# Patient Record
Sex: Female | Born: 1970
Health system: Southern US, Community
[De-identification: ages and names within clinical notes are randomized; demographics above are authoritative.]

## PROBLEM LIST (undated history)

## (undated) DIAGNOSIS — M7700 Medial epicondylitis, unspecified elbow: Secondary | ICD-10-CM

## (undated) DIAGNOSIS — K222 Esophageal obstruction: Secondary | ICD-10-CM

## (undated) HISTORY — PX: UPPER GASTROINTESTINAL ENDOSCOPY: SHX188

## (undated) HISTORY — DX: Medial epicondylitis, unspecified elbow: M77.00

## (undated) HISTORY — DX: Esophageal obstruction: K22.2

## (undated) HISTORY — PX: OTHER SURGICAL HISTORY: SHX169

---

## 2011-11-16 LAB — HM PAP SMEAR

## 2013-11-15 LAB — HM PAP SMEAR

## 2015-03-07 ENCOUNTER — Other Ambulatory Visit: Payer: Self-pay | Admitting: Family

## 2015-03-07 DIAGNOSIS — Z1231 Encounter for screening mammogram for malignant neoplasm of breast: Secondary | ICD-10-CM

## 2015-04-09 ENCOUNTER — Ambulatory Visit
Admission: RE | Admit: 2015-04-09 | Discharge: 2015-04-09 | Disposition: A | Discharge status: Home / Self Care | Payer: BLUE CROSS/BLUE SHIELD | Source: Ambulatory Visit | Attending: Family | Admitting: Family

## 2015-04-09 DIAGNOSIS — Z1231 Encounter for screening mammogram for malignant neoplasm of breast: Secondary | ICD-10-CM

## 2015-04-09 LAB — HM MAMMOGRAPHY: HM MAMMO: NEGATIVE

## 2015-10-21 ENCOUNTER — Ambulatory Visit: Payer: BLUE CROSS/BLUE SHIELD | Admitting: Sports Medicine

## 2015-10-23 ENCOUNTER — Ambulatory Visit (INDEPENDENT_AMBULATORY_CARE_PROVIDER_SITE_OTHER): Payer: BLUE CROSS/BLUE SHIELD | Admitting: Sports Medicine

## 2015-10-23 ENCOUNTER — Encounter: Payer: Self-pay | Admitting: Sports Medicine

## 2015-10-23 VITALS — BP 114/70 | Ht 64.0 in | Wt 176.4 lb

## 2015-10-23 DIAGNOSIS — M7702 Medial epicondylitis, left elbow: Secondary | ICD-10-CM | POA: Diagnosis not present

## 2015-10-23 MED ORDER — NITROGLYCERIN 0.2 MG/HR TD PT24: MEDICATED_PATCH | TRANSDERMAL | Status: DC

## 2015-10-23 NOTE — Progress Notes (Signed)
   Subjective:    Patient ID: Erica David, female    DOB: [birth year], 44 y.o.   MRN: [id number]  HPI chief complaint: Right elbow pain  Very pleasant 44 year old right-hand-dominant female comes in today complaining of 5 months of right elbow pain. Pain began suddenly while playing tennis back in July without any trauma. She initially had medial sided elbow pain which has now progressed to involve the entire elbow. She has continued to play tennis despite her pain. Pain is present primarily with grip as well as with picking up heavy objects. She denies numbness or tingling. She does get some discomfort at night while trying to sleep. No problems with this elbow in the past. She has tried some acupuncture without any good success. She has not had any imaging.  Past medical history reviewed Medications reviewed Allergies reviewed    Review of Systems    as above Objective:   Physical Exam Well-developed, well-nourished. No acute distress. Awake alert and oriented 3. Vital signs reviewed  Right elbow: Full range of motion. No effusion. There is tenderness to palpation directly over the medial epicondyle with reproducible pain with resisted wrist flexion and ulnar deviation. There is no tenderness to palpation over the lateral epicondyle. Negative Tinel's over the cubital tunnel. Good grip strength. Good radial and ulnar pulses.  MSK ultrasound of the right elbow was performed. Limited images of the medial elbow were obtained. There is a hypoechoic area in the mid substance of the common extensor tendon consistent with a partial tendon tear. No significant neovascularity is seen.       Assessment & Plan:  Right elbow pain secondary to medial epicondylitis/common flexor tendon tendinopathy with partial tearing  Patient will start a topical nitroglycerin protocol. She will use a quarter patch daily. I would also like to send her for some formal physical therapy with the hand and  rehabilitation specialists. Follow-up with me in 6 weeks for reevaluation. In the meantime no tennis but I did explain to her the importance of working with a good coach and practicing proper form and avoiding certain strokes which may aggravate her pain. I also think she needs to have her tennis racquet evaluated to make sure that the grip is appropriate as well as the string tension.

## 2015-10-23 NOTE — Patient Instructions (Signed)

## 2015-12-04 ENCOUNTER — Encounter: Payer: Self-pay | Admitting: Sports Medicine

## 2015-12-04 ENCOUNTER — Ambulatory Visit (INDEPENDENT_AMBULATORY_CARE_PROVIDER_SITE_OTHER): Payer: BLUE CROSS/BLUE SHIELD | Admitting: Sports Medicine

## 2015-12-04 VITALS — BP 109/54 | Ht 64.0 in | Wt 176.0 lb

## 2015-12-04 DIAGNOSIS — M7701 Medial epicondylitis, right elbow: Secondary | ICD-10-CM | POA: Diagnosis not present

## 2015-12-05 NOTE — Progress Notes (Signed)
   Subjective:    Patient ID: Erica David, female    DOB: 02-28-1971, 45 y.o.   MRN: [id number]  HPI   Patient comes in today for follow-up on right elbow medial epicondylitis. It has been 6 weeks since her last office visit. Symptoms are improving. She is no longer having pain with activities of daily living. She actually returned to playing tennis a couple of days ago and had no pain while playing but was a little bit sore the following day. Today she is pain-free. She has been using a compression sleeve which she has found helpful. She has been using her topical nitroglycerin without side effect. She has also been working diligently with physical therapy. She is getting a little bit of discomfort on the lateral aspect of the elbow as well but describes it as more of a "tightness" more than pain. She continues to deny numbness or tingling into the forearm or hand. Prior ultrasound of this elbow showed a small partial tendon tear in the common extensor tendon.    Review of Systems     Objective:   Physical Exam Well-developed, well-nourished. No acute distress  Right elbow: Full range of motion. No effusion. No soft tissue swelling. Mild tenderness to palpation over the medial epicondyle but not marked. Minimal pain with resisted wrist flexion and ulnar deviation. Negative Tinel's at the cubital tunnel. No significant tenderness to palpation over the lateral elbow. Neurovascularly intact distally. Good grip strength.  MSK ultrasound of the right elbow was performed. Limited images of the medial elbow were obtained. The hypoechoic area seen on her prior ultrasound is no longer evident.       Assessment & Plan:  Improving right elbow pain secondary to medial epicondylitis  I want the patient to continue with her topical nitroglycerin for 6 more weeks for total of 3 months of treatment. I want her to continue working with physical therapy until they feel she is ready to graduate to a home  exercise program. I think she can use either her body helix compression sleeve or a counterforce brace, which ever one is more comfortable, with tennis. I did caution her about returning to tennis too quickly but I think she is okay to start with playing once a week. She is in the process of having her tennis racquet evaluated for string tension. I did explain to her that it may be another 6 weeks or so for complete symptom resolution. She will need to limit her activities, including tennis, based on her symptoms and continue with her home exercise program. As long as symptoms continue to improve to the point of resolution she may follow-up with me as needed.

## 2015-12-15 ENCOUNTER — Encounter: Payer: Self-pay | Admitting: Family Medicine

## 2015-12-15 ENCOUNTER — Ambulatory Visit (INDEPENDENT_AMBULATORY_CARE_PROVIDER_SITE_OTHER): Payer: BLUE CROSS/BLUE SHIELD | Admitting: Family Medicine

## 2015-12-15 ENCOUNTER — Institutional Professional Consult (permissible substitution): Payer: Self-pay | Admitting: Family Medicine

## 2015-12-15 VITALS — BP 122/64 | HR 68 | Ht 65.5 in | Wt 181.4 lb

## 2015-12-15 DIAGNOSIS — Z7189 Other specified counseling: Secondary | ICD-10-CM

## 2015-12-15 DIAGNOSIS — M7701 Medial epicondylitis, right elbow: Secondary | ICD-10-CM

## 2015-12-15 DIAGNOSIS — Z7689 Persons encountering health services in other specified circumstances: Secondary | ICD-10-CM

## 2015-12-15 DIAGNOSIS — Z23 Encounter for immunization: Secondary | ICD-10-CM

## 2015-12-15 DIAGNOSIS — M7700 Medial epicondylitis, unspecified elbow: Secondary | ICD-10-CM | POA: Insufficient documentation

## 2015-12-15 NOTE — Progress Notes (Signed)
   Subjective:    Patient ID: Erica David, female    DOB: 24-May-1971, 45 y.o.   MRN: [id number]  HPI Chief Complaint  Patient presents with  . new pt    new pt. get established. no other concerns, declines flu shots   She is new to the practice and here to establish primary care. She moved here from [place] a couple of years ago due to husband's job.  Other providers: Dr. Clint Bolder for tennis elbow. Is doing PT for same. Right elbow. Wears nitroglycerin patch to elbow daily until end of February. She has played tennis her whole life.   Last physical exam: 18 months ago. Pap smear- 2 years ago and mammogram- has them annually,  States her sister was diagnosed with breast cancer at age 75. She has a Corporate treasurer and is due to come out at age 37. She does not have menstrual cycles.  Past medical history- endoscopy in past, states she has gotten air bubble in past in throat based on the food she eats. She was taking Nexium daily. Stopped taking it after dilation of esophagus. Doing well with this.   Surgeries: Endoscopy x 2 with stretching esophagus.   colonoscopy: Never family history- sister with breast cancer at 11. HTN   Social history: denies smoking, social alcohol use, never used drugs.   Lives with husband and 3 kids. Works as an Optometrist. Likes to play tennis but has not been able to. Not really doing other exercise now.   She questions whether she needs a flu shot or not at this point.    Review of Systems Pertinent positives and negatives in the history of present illness.     Objective:   Physical Exam BP 122/64 mmHg  Pulse 68  Ht 5' 5.5" (1.664 m)  Wt 181 lb 6.4 oz (82.283 kg)  BMI 29.72 kg/m2  Alert and in no distress.  Cardiac exam shows a regular sinus rhythm without murmurs or gallops. Lungs are clear to auscultation.      Assessment & Plan:  Epicondylitis elbow, medial, right  Encounter to establish care  Need for prophylactic vaccination and inoculation  against influenza - Plan: Flu Vaccine QUAD 36+ mos IM   Recommend she continue seeing Dr. Micheline Chapman for tennis elbow, symptoms appear to be improving with current treatment regimen.  Recommend that while she cannot play tennis that she try to do other forms of cardiovascular exercise.  Flu shot given.   discussed that if she continues to have issues with esophageal pain or food impactions that I will refer her to gastroenterology. She will continue being careful when chewing meat and other foods that could cause obstruction.  she will return for fasting blood work and a complete physical exam including Pap smear.  She will also need referral for mammogram.

## 2015-12-24 ENCOUNTER — Ambulatory Visit (INDEPENDENT_AMBULATORY_CARE_PROVIDER_SITE_OTHER): Payer: BLUE CROSS/BLUE SHIELD | Admitting: Family Medicine

## 2015-12-24 ENCOUNTER — Encounter: Payer: Self-pay | Admitting: Family Medicine

## 2015-12-24 ENCOUNTER — Other Ambulatory Visit (HOSPITAL_COMMUNITY)
Admission: RE | Admit: 2015-12-24 | Discharge: 2015-12-24 | Disposition: A | Discharge status: Home / Self Care | Payer: BLUE CROSS/BLUE SHIELD | Source: Ambulatory Visit | Attending: Family Medicine | Admitting: Family Medicine

## 2015-12-24 VITALS — BP 116/64 | HR 60 | Ht 66.0 in | Wt 182.6 lb

## 2015-12-24 DIAGNOSIS — Z0000 Encounter for general adult medical examination without abnormal findings: Secondary | ICD-10-CM | POA: Diagnosis not present

## 2015-12-24 DIAGNOSIS — Z124 Encounter for screening for malignant neoplasm of cervix: Secondary | ICD-10-CM

## 2015-12-24 DIAGNOSIS — R5383 Other fatigue: Secondary | ICD-10-CM | POA: Diagnosis not present

## 2015-12-24 DIAGNOSIS — Z803 Family history of malignant neoplasm of breast: Secondary | ICD-10-CM

## 2015-12-24 DIAGNOSIS — M25561 Pain in right knee: Secondary | ICD-10-CM

## 2015-12-24 DIAGNOSIS — Z01411 Encounter for gynecological examination (general) (routine) with abnormal findings: Secondary | ICD-10-CM | POA: Diagnosis present

## 2015-12-24 DIAGNOSIS — Z1151 Encounter for screening for human papillomavirus (HPV): Secondary | ICD-10-CM | POA: Diagnosis not present

## 2015-12-24 DIAGNOSIS — Z1211 Encounter for screening for malignant neoplasm of colon: Secondary | ICD-10-CM | POA: Diagnosis not present

## 2015-12-24 DIAGNOSIS — Z1239 Encounter for other screening for malignant neoplasm of breast: Secondary | ICD-10-CM

## 2015-12-24 LAB — CBC WITH DIFFERENTIAL/PLATELET
Basophils Absolute: 0 10*3/uL (ref 0.0–0.1)
Basophils Relative: 0 % (ref 0–1)
EOS PCT: 5 % (ref 0–5)
Eosinophils Absolute: 0.3 10*3/uL (ref 0.0–0.7)
HCT: 41.8 % (ref 36.0–46.0)
Hemoglobin: 13.8 g/dL (ref 12.0–15.0)
LYMPHS ABS: 1.6 10*3/uL (ref 0.7–4.0)
LYMPHS PCT: 29 % (ref 12–46)
MCH: 30.3 pg (ref 26.0–34.0)
MCHC: 33 g/dL (ref 30.0–36.0)
MCV: 91.9 fL (ref 78.0–100.0)
MONO ABS: 0.4 10*3/uL (ref 0.1–1.0)
MPV: 9.2 fL (ref 8.6–12.4)
Monocytes Relative: 8 % (ref 3–12)
Neutro Abs: 3.1 10*3/uL (ref 1.7–7.7)
Neutrophils Relative %: 58 % (ref 43–77)
Platelets: 212 10*3/uL (ref 150–400)
RBC: 4.55 MIL/uL (ref 3.87–5.11)
RDW: 13.9 % (ref 11.5–15.5)
WBC: 5.4 10*3/uL (ref 4.0–10.5)

## 2015-12-24 LAB — POCT URINALYSIS DIPSTICK
Bilirubin, UA: NEGATIVE
GLUCOSE UA: NEGATIVE
Ketones, UA: NEGATIVE
LEUKOCYTES UA: NEGATIVE
NITRITE UA: NEGATIVE
Protein, UA: NEGATIVE
RBC UA: NEGATIVE
UROBILINOGEN UA: NEGATIVE
pH, UA: 6

## 2015-12-24 LAB — COMPREHENSIVE METABOLIC PANEL
ALT: 10 U/L (ref 6–29)
AST: 21 U/L (ref 10–35)
Albumin: 3.9 g/dL (ref 3.6–5.1)
Alkaline Phosphatase: 51 U/L (ref 33–115)
BUN: 10 mg/dL (ref 7–25)
CALCIUM: 8.8 mg/dL (ref 8.6–10.2)
CO2: 27 mmol/L (ref 20–31)
Chloride: 105 mmol/L (ref 98–110)
Creat: 0.72 mg/dL (ref 0.50–1.10)
GLUCOSE: 83 mg/dL (ref 65–99)
POTASSIUM: 4.3 mmol/L (ref 3.5–5.3)
Sodium: 139 mmol/L (ref 135–146)
Total Bilirubin: 0.5 mg/dL (ref 0.2–1.2)
Total Protein: 6.5 g/dL (ref 6.1–8.1)

## 2015-12-24 LAB — LIPID PANEL
CHOLESTEROL: 145 mg/dL (ref 125–200)
HDL: 53 mg/dL (ref >=46)
LDL CALC: 82 mg/dL (ref <130)
Total CHOL/HDL Ratio: 2.7 Ratio (ref <=5.0)
Triglycerides: 49 mg/dL (ref <150)
VLDL: 10 mg/dL (ref <30)

## 2015-12-24 LAB — TSH: TSH: 2.78 m[IU]/L

## 2015-12-24 NOTE — Progress Notes (Signed)
Subjective:    Patient ID: Erica David, female    DOB: August 25, 1971, 44 y.o.   MRN: [id number]  HPI Chief Complaint  Patient presents with  . fasting cpe    fasting cpe with pap. sees eye doctor. knee pain from playing tennis   She is here for a complete physical exam and fasting blood work. She was playing tennis yesterday and states her right knee started hurting. Reports 2 week history of right knee swelling and pain with certain movements. No known injury. Reports pain improved with rest. Denies locking, popping, giving away. Seeing [facility] for right elbow pain, was diagnosed with medial epicondylitis and is being treated for this. She states he also examined her knee and told her that there is no structural or cartilage damage.   Reports feeling more tired recently, approximately for the past couple of months. She states this could be related to keeping up with her 3 children but questions whether something could be wrong physically. Reports level of fatigue as mild.  States she does not have difficulty sleeping.  Denies fever, chills, weight loss, chest pain, dizziness, GI or GU issues.   Last physical was 2015.  Eye exam was in December 2016. Wears contact lenses.  No hearing issues.   She has a Mirena IUD and it is scheduled to come out 2020.  Last pap smear: 11/2011 Mammogram: 03/2015 Colonoscopy: never   She lives with husband and has 3 kids. She denies smoking and drug use. Drinks occasionally.   Family history of breast cancer in sister. HTN.   Immunizations: flu shot- current. Tdap 07/2014 Wears sunscreen, always wears seatbelt, no guns in home. Smoke detectors in home and working.   Reviewed allergies, medications, past medical, surgical, social and family history.   Review of Systems Review of Systems Constitutional: -fever, -chills, -sweats, -unexpected weight change,+fatigue ENT: -runny nose, -ear pain, -sore throat Cardiology:  -chest pain,  -palpitations, -edema Respiratory: -cough, -shortness of breath, -wheezing Gastroenterology: -abdominal pain, -nausea, -vomiting, -diarrhea, -constipation  Hematology: -bleeding or bruising problems Musculoskeletal: -arthralgias, -myalgias, -joint swelling, -back pain Ophthalmology: -vision changes Urology: -dysuria, -difficulty urinating, -hematuria, -urinary frequency, -urgency Neurology: -headache, -weakness, -tingling, -numbness       Objective:   Physical Exam BP 116/64 mmHg  Pulse 60  Ht 5\' 6"  (1.676 m)  Wt 182 lb 9.6 oz (82.827 kg)  BMI 29.49 kg/m2  General Appearance:    Alert, cooperative, no distress, appears stated age  Head:    Normocephalic, without obvious abnormality, atraumatic  Eyes:    PERRL, conjunctiva/corneas clear, EOM's intact, fundi    benign  Ears:    Normal TM's and external ear canals  Nose:   Nares normal, mucosa normal, no drainage or sinus   tenderness  Throat:   Lips, mucosa, and tongue normal; teeth and gums normal  Neck:   Supple, no lymphadenopathy;  thyroid:  no   enlargement/tenderness/nodules; no carotid   bruit or JVD  Back:    Spine nontender, no curvature, ROM normal, no CVA     tenderness  Lungs:     Clear to auscultation bilaterally without wheezes, rales or     ronchi; respirations unlabored  Chest Wall:    No tenderness or deformity   Heart:    Regular rate and rhythm, S1 and S2 normal, no murmur, rub   or gallop  Breast Exam:    No tenderness, masses, or nipple discharge or inversion.      No axillary  lymphadenopathy  Abdomen:     Soft, non-tender, nondistended, normoactive bowel sounds,    no masses, no hepatosplenomegaly  Genitalia:    Normal external genitalia without lesions.  BUS and vagina normal; cervix without lesions, or cervical motion tenderness. No abnormal vaginal discharge.  Uterus and adnexa not enlarged, nontender, no masses.  Pap performed. IUD strings visible.   Rectal:    Not performed. Fecal occult stool cards  given.   Extremities:   No clubbing, cyanosis or edema. Right knee with mild edema to anterior lateral aspect, no erythema, non tender, no laxity, normal ROM and strength, negative Mcmurrays.   Pulses:   2+ and symmetric all extremities  Skin:   Skin color, texture, turgor normal, no rashes or lesions  Lymph nodes:   Cervical, supraclavicular, and axillary nodes normal  Neurologic:   CNII-XII intact, normal strength, sensation and gait; reflexes 2+ and symmetric throughout          Psych:   Normal mood, affect, hygiene and grooming.    Urinalysis dipstick: negative     Assessment & Plan:  Routine general medical examination at a health care facility - Plan: CBC with Differential/Platelet, Comprehensive metabolic panel, POCT urinalysis dipstick, Lipid panel, VITAMIN D 25 Hydroxy (Vit-D Deficiency, Fractures), TSH, Fecal Occult Blood, Guaiac  Other fatigue - Plan: CBC with Differential/Platelet, Comprehensive metabolic panel, VITAMIN D 25 Hydroxy (Vit-D Deficiency, Fractures), TSH  Knee pain, acute, right - Plan: VITAMIN D 25 Hydroxy (Vit-D Deficiency, Fractures)  Screening for cervical cancer - Plan: Cytology - PAP  Screening for breast cancer - Plan: MM DIGITAL SCREENING BILATERAL  Family history of breast cancer in sister - Plan: MM DIGITAL SCREENING BILATERAL  Special screening for malignant neoplasms, colon - Plan: Fecal Occult Blood, Guaiac  Discuss multiple etiologies for fatigue. Discussed eating healthy diet and getting at least 150 minutes of physical activity per week. Her level of fatigue appears to be mild and is not keeping her from her daily activities, will follow up on this pending lab results.  Discussed starting vitamin D supplement over the counter.  Recommend low impact exercise until right knee edema and discomfort calms down. She may try taking ibuprofen or Aleve for this as well. Discussed that after exercise, applying ice to the knee for 15-20 minutes may improve  pain and swelling.  Discussed safety and health promotion. Order in the computer for screening mammogram, she is due for this in May 2017.  3 fecal occult stool cards given with instructions.

## 2015-12-24 NOTE — Patient Instructions (Addendum)
Petra Kuba Made is brand I recommend for vitamin D. 800 or 1000 IU daily.   Preventative Care for Adults - Female      MAINTAIN REGULAR HEALTH EXAMS:  A routine yearly physical is a good way to check in with your primary care provider about your health and preventive screening. It is also an opportunity to share updates about your health and any concerns you have, and receive a thorough all-over exam.   Most health insurance companies pay for at least some preventative services.  Check with your health plan for specific coverages.  WHAT PREVENTATIVE SERVICES DO WOMEN NEED?  Adult women should have their weight and blood pressure checked regularly.   Women age 23 and older should have their cholesterol levels checked regularly.  Women should be screened for cervical cancer with a Pap smear and pelvic exam beginning at either age 37, or 3 years after they become sexually activity.    Breast cancer screening generally begins at age 57 with a mammogram and breast exam by your primary care provider.    Beginning at age 54 and continuing to age 39, women should be screened for colorectal cancer.  Certain people may need continued testing until age 79.  Updating vaccinations is part of preventative care.  Vaccinations help protect against diseases such as the flu.  Osteoporosis is a disease in which the bones lose minerals and strength as we age. Women ages 32 and over should discuss this with their caregivers, as should women after menopause who have other risk factors.  Lab tests are generally done as part of preventative care to screen for anemia and blood disorders, to screen for problems with the kidneys and liver, to screen for bladder problems, to check blood sugar, and to check your cholesterol level.  Preventative services generally include counseling about diet, exercise, avoiding tobacco, drugs, excessive alcohol consumption, and sexually transmitted infections.    GENERAL  RECOMMENDATIONS FOR GOOD HEALTH:  Healthy diet:  Eat a variety of foods, including fruit, vegetables, animal or vegetable protein, such as meat, fish, chicken, and eggs, or beans, lentils, tofu, and grains, such as rice.  Drink plenty of water daily.  Decrease saturated fat in the diet, avoid lots of red meat, processed foods, sweets, fast foods, and fried foods.  Exercise:  Aerobic exercise helps maintain good heart health. At least 30-40 minutes of moderate-intensity exercise is recommended. For example, a brisk walk that increases your heart rate and breathing. This should be done on most days of the week.   Find a type of exercise or a variety of exercises that you enjoy so that it becomes a part of your daily life.  Examples are running, walking, swimming, water aerobics, and biking.  For motivation and support, explore group exercise such as aerobic class, spin class, Zumba, Yoga,or  martial arts, etc.    Set exercise goals for yourself, such as a certain weight goal, walk or run in a race such as a 5k walk/run.  Speak to your primary care provider about exercise goals.  Disease prevention:  If you smoke or chew tobacco, find out from your caregiver how to quit. It can literally save your life, no matter how long you have been a tobacco user. If you do not use tobacco, never begin.   Maintain a healthy diet and normal weight. Increased weight leads to problems with blood pressure and diabetes.   The Body Mass Index or BMI is a way of measuring how much  of your body is fat. Having a BMI above 27 increases the risk of heart disease, diabetes, hypertension, stroke and other problems related to obesity. Your caregiver can help determine your BMI and based on it develop an exercise and dietary program to help you achieve or maintain this important measurement at a healthful level.  High blood pressure causes heart and blood vessel problems.  Persistent high blood pressure should be treated  with medicine if weight loss and exercise do not work.   Fat and cholesterol leaves deposits in your arteries that can block them. This causes heart disease and vessel disease elsewhere in your body.  If your cholesterol is found to be high, or if you have heart disease or certain other medical conditions, then you may need to have your cholesterol monitored frequently and be treated with medication.   Ask if you should have a cardiac stress test if your history suggests this. A stress test is a test done on a treadmill that looks for heart disease. This test can find disease prior to there being a problem.  Menopause can be associated with physical symptoms and risks. Hormone replacement therapy is available to decrease these. You should talk to your caregiver about whether starting or continuing to take hormones is right for you.   Osteoporosis is a disease in which the bones lose minerals and strength as we age. This can result in serious bone fractures. Risk of osteoporosis can be identified using a bone density scan. Women ages 35 and over should discuss this with their caregivers, as should women after menopause who have other risk factors. Ask your caregiver whether you should be taking a calcium supplement and Vitamin D, to reduce the rate of osteoporosis.   Avoid drinking alcohol in excess (more than two drinks per day).  Avoid use of street drugs. Do not share needles with anyone. Ask for professional help if you need assistance or instructions on stopping the use of alcohol, cigarettes, and/or drugs.  Brush your teeth twice a day with fluoride toothpaste, and floss once a day. Good oral hygiene prevents tooth decay and gum disease. The problems can be painful, unattractive, and can cause other health problems. Visit your dentist for a routine oral and dental check up and preventive care every 6-12 months.   Look at your skin regularly.  Use a mirror to look at your back. Notify your  caregivers of changes in moles, especially if there are changes in shapes, colors, a size larger than a pencil eraser, an irregular border, or development of new moles.  Safety:  Use seatbelts 100% of the time, whether driving or as a passenger.  Use safety devices such as hearing protection if you work in environments with loud noise or significant background noise.  Use safety glasses when doing any work that could send debris in to the eyes.  Use a helmet if you ride a bike or motorcycle.  Use appropriate safety gear for contact sports.  Talk to your caregiver about gun safety.  Use sunscreen with a SPF (or skin protection factor) of 15 or greater.  Lighter skinned people are at a greater risk of skin cancer. Don't forget to also wear sunglasses in order to protect your eyes from too much damaging sunlight. Damaging sunlight can accelerate cataract formation.   Practice safe sex. Use condoms. Condoms are used for birth control and to help reduce the spread of sexually transmitted infections (or STIs).  Some of the STIs are  gonorrhea (the clap), chlamydia, syphilis, trichomonas, herpes, HPV (human papilloma virus) and HIV (human immunodeficiency virus) which causes AIDS. The herpes, HIV and HPV are viral illnesses that have no cure. These can result in disability, cancer and death.   Keep carbon monoxide and smoke detectors in your home functioning at all times. Change the batteries every 6 months or use a model that plugs into the wall.   Vaccinations:  Stay up to date with your tetanus shots and other required immunizations. You should have a booster for tetanus every 10 years. Be sure to get your flu shot every year, since 5%-20% of the [place] population comes down with the flu. The flu vaccine changes each year, so being vaccinated once is not enough. Get your shot in the fall, before the flu season peaks.   Other vaccines to consider:  Human Papilloma Virus or HPV causes cancer of the cervix,  and other infections that can be transmitted from person to person. There is a vaccine for HPV, and females should get immunized between the ages of 74 and 49. It requires a series of 3 shots.   Pneumococcal vaccine to protect against certain types of pneumonia.  This is normally recommended for adults age 44 or older.  However, adults younger than 47 years old with certain underlying conditions such as diabetes, heart or lung disease should also receive the vaccine.  Shingles vaccine to protect against Varicella Zoster if you are older than age 65, or younger than 47 years old with certain underlying illness.  Hepatitis A vaccine to protect against a form of infection of the liver by a virus acquired from food.  Hepatitis B vaccine to protect against a form of infection of the liver by a virus acquired from blood or body fluids, particularly if you work in health care.  If you plan to travel internationally, check with your local health department for specific vaccination recommendations.  Cancer Screening:  Breast cancer screening is essential to preventive care for women. All women age 62 and older should perform a breast self-exam every month. At age 69 and older, women should have their caregiver complete a breast exam each year. Women at ages 41 and older should have a mammogram (x-ray film) of the breasts. Your caregiver can discuss how often you need mammograms.    Cervical cancer screening includes taking a Pap smear (sample of cells examined under a microscope) from the cervix (end of the uterus). It also includes testing for HPV (Human Papilloma Virus, which can cause cervical cancer). Screening and a pelvic exam should begin at age 46, or 3 years after a woman becomes sexually active. Screening should occur every year, with a Pap smear but no HPV testing, up to age 98. After age 44, you should have a Pap smear every 3 years with HPV testing, if no HPV was found previously.   Most  routine colon cancer screening begins at the age of 2. On a yearly basis, doctors may provide special easy to use take-home tests to check for hidden blood in the stool. Sigmoidoscopy or colonoscopy can detect the earliest forms of colon cancer and is life saving. These tests use a small camera at the end of a tube to directly examine the colon. Speak to your caregiver about this at age 18, when routine screening begins (and is repeated every 5 years unless early forms of pre-cancerous polyps or small growths are found).

## 2015-12-25 LAB — CYTOLOGY - PAP

## 2015-12-25 LAB — VITAMIN D 25 HYDROXY (VIT D DEFICIENCY, FRACTURES): Vit D, 25-Hydroxy: 33 ng/mL (ref 30–100)

## 2015-12-29 ENCOUNTER — Other Ambulatory Visit (INDEPENDENT_AMBULATORY_CARE_PROVIDER_SITE_OTHER): Payer: BLUE CROSS/BLUE SHIELD

## 2015-12-29 DIAGNOSIS — Z0000 Encounter for general adult medical examination without abnormal findings: Secondary | ICD-10-CM | POA: Diagnosis not present

## 2015-12-29 LAB — POC HEMOCCULT BLD/STL (HOME/3-CARD/SCREEN)
Card #2 Fecal Occult Blod, POC: NEGATIVE
FECAL OCCULT BLD: NEGATIVE
FECAL OCCULT BLD: NEGATIVE

## 2016-04-15 ENCOUNTER — Ambulatory Visit
Admission: RE | Admit: 2016-04-15 | Discharge: 2016-04-15 | Disposition: A | Discharge status: Home / Self Care | Payer: BLUE CROSS/BLUE SHIELD | Source: Ambulatory Visit | Attending: Family Medicine | Admitting: Family Medicine

## 2016-04-15 DIAGNOSIS — Z1231 Encounter for screening mammogram for malignant neoplasm of breast: Secondary | ICD-10-CM | POA: Diagnosis not present

## 2016-04-15 DIAGNOSIS — Z803 Family history of malignant neoplasm of breast: Secondary | ICD-10-CM

## 2016-04-15 DIAGNOSIS — Z1239 Encounter for other screening for malignant neoplasm of breast: Secondary | ICD-10-CM

## 2016-06-11 DIAGNOSIS — M7701 Medial epicondylitis, right elbow: Secondary | ICD-10-CM | POA: Diagnosis not present

## 2016-06-11 DIAGNOSIS — M9902 Segmental and somatic dysfunction of thoracic region: Secondary | ICD-10-CM | POA: Diagnosis not present

## 2016-06-11 DIAGNOSIS — M7711 Lateral epicondylitis, right elbow: Secondary | ICD-10-CM | POA: Diagnosis not present

## 2016-06-11 DIAGNOSIS — M9907 Segmental and somatic dysfunction of upper extremity: Secondary | ICD-10-CM | POA: Diagnosis not present

## 2016-06-11 DIAGNOSIS — M9901 Segmental and somatic dysfunction of cervical region: Secondary | ICD-10-CM | POA: Diagnosis not present

## 2016-06-17 DIAGNOSIS — M7701 Medial epicondylitis, right elbow: Secondary | ICD-10-CM | POA: Diagnosis not present

## 2016-06-17 DIAGNOSIS — M7711 Lateral epicondylitis, right elbow: Secondary | ICD-10-CM | POA: Diagnosis not present

## 2016-06-17 DIAGNOSIS — M9907 Segmental and somatic dysfunction of upper extremity: Secondary | ICD-10-CM | POA: Diagnosis not present

## 2016-06-17 DIAGNOSIS — M9902 Segmental and somatic dysfunction of thoracic region: Secondary | ICD-10-CM | POA: Diagnosis not present

## 2016-06-17 DIAGNOSIS — M9901 Segmental and somatic dysfunction of cervical region: Secondary | ICD-10-CM | POA: Diagnosis not present

## 2016-06-22 DIAGNOSIS — M7701 Medial epicondylitis, right elbow: Secondary | ICD-10-CM | POA: Diagnosis not present

## 2016-06-22 DIAGNOSIS — M7711 Lateral epicondylitis, right elbow: Secondary | ICD-10-CM | POA: Diagnosis not present

## 2016-06-22 DIAGNOSIS — M9902 Segmental and somatic dysfunction of thoracic region: Secondary | ICD-10-CM | POA: Diagnosis not present

## 2016-06-22 DIAGNOSIS — M9907 Segmental and somatic dysfunction of upper extremity: Secondary | ICD-10-CM | POA: Diagnosis not present

## 2016-06-22 DIAGNOSIS — M9901 Segmental and somatic dysfunction of cervical region: Secondary | ICD-10-CM | POA: Diagnosis not present

## 2016-06-28 DIAGNOSIS — M9907 Segmental and somatic dysfunction of upper extremity: Secondary | ICD-10-CM | POA: Diagnosis not present

## 2016-06-28 DIAGNOSIS — M9902 Segmental and somatic dysfunction of thoracic region: Secondary | ICD-10-CM | POA: Diagnosis not present

## 2016-06-28 DIAGNOSIS — M9901 Segmental and somatic dysfunction of cervical region: Secondary | ICD-10-CM | POA: Diagnosis not present

## 2016-06-28 DIAGNOSIS — M7711 Lateral epicondylitis, right elbow: Secondary | ICD-10-CM | POA: Diagnosis not present

## 2016-06-28 DIAGNOSIS — M7701 Medial epicondylitis, right elbow: Secondary | ICD-10-CM | POA: Diagnosis not present

## 2016-07-22 DIAGNOSIS — M7711 Lateral epicondylitis, right elbow: Secondary | ICD-10-CM | POA: Diagnosis not present

## 2016-07-22 DIAGNOSIS — M9901 Segmental and somatic dysfunction of cervical region: Secondary | ICD-10-CM | POA: Diagnosis not present

## 2016-07-22 DIAGNOSIS — M9902 Segmental and somatic dysfunction of thoracic region: Secondary | ICD-10-CM | POA: Diagnosis not present

## 2016-07-22 DIAGNOSIS — M7701 Medial epicondylitis, right elbow: Secondary | ICD-10-CM | POA: Diagnosis not present

## 2016-07-22 DIAGNOSIS — M9907 Segmental and somatic dysfunction of upper extremity: Secondary | ICD-10-CM | POA: Diagnosis not present

## 2016-08-05 DIAGNOSIS — M7711 Lateral epicondylitis, right elbow: Secondary | ICD-10-CM | POA: Diagnosis not present

## 2016-08-05 DIAGNOSIS — M7701 Medial epicondylitis, right elbow: Secondary | ICD-10-CM | POA: Diagnosis not present

## 2016-08-05 DIAGNOSIS — M9901 Segmental and somatic dysfunction of cervical region: Secondary | ICD-10-CM | POA: Diagnosis not present

## 2016-08-05 DIAGNOSIS — M9902 Segmental and somatic dysfunction of thoracic region: Secondary | ICD-10-CM | POA: Diagnosis not present

## 2016-08-05 DIAGNOSIS — M9907 Segmental and somatic dysfunction of upper extremity: Secondary | ICD-10-CM | POA: Diagnosis not present

## 2016-09-04 DIAGNOSIS — M7701 Medial epicondylitis, right elbow: Secondary | ICD-10-CM | POA: Diagnosis not present

## 2016-09-04 DIAGNOSIS — M9907 Segmental and somatic dysfunction of upper extremity: Secondary | ICD-10-CM | POA: Diagnosis not present

## 2016-09-04 DIAGNOSIS — M9901 Segmental and somatic dysfunction of cervical region: Secondary | ICD-10-CM | POA: Diagnosis not present

## 2016-09-04 DIAGNOSIS — M7711 Lateral epicondylitis, right elbow: Secondary | ICD-10-CM | POA: Diagnosis not present

## 2016-09-04 DIAGNOSIS — M9902 Segmental and somatic dysfunction of thoracic region: Secondary | ICD-10-CM | POA: Diagnosis not present

## 2016-09-09 DIAGNOSIS — M9903 Segmental and somatic dysfunction of lumbar region: Secondary | ICD-10-CM | POA: Diagnosis not present

## 2016-09-09 DIAGNOSIS — M7711 Lateral epicondylitis, right elbow: Secondary | ICD-10-CM | POA: Diagnosis not present

## 2016-09-09 DIAGNOSIS — M7701 Medial epicondylitis, right elbow: Secondary | ICD-10-CM | POA: Diagnosis not present

## 2016-09-09 DIAGNOSIS — M9901 Segmental and somatic dysfunction of cervical region: Secondary | ICD-10-CM | POA: Diagnosis not present

## 2016-10-01 DIAGNOSIS — Z23 Encounter for immunization: Secondary | ICD-10-CM | POA: Diagnosis not present

## 2016-11-23 DIAGNOSIS — H5203 Hypermetropia, bilateral: Secondary | ICD-10-CM | POA: Diagnosis not present

## 2016-12-26 ENCOUNTER — Encounter: Payer: Self-pay | Admitting: Family Medicine

## 2016-12-26 NOTE — Progress Notes (Signed)
Subjective:    Patient ID: Erica David, female    DOB: 1971/03/27, 46 y.o.   MRN: [id number]  HPI Chief Complaint  Patient presents with  . fasting cpe    fasting cpe. no other concerns. had eyes examed early january.    She is here for a complete physical exam.  Reports having an issue with food getting stuck sometime in December and she was unable to swallow her saliva. Resolved after 2-3 hours and she had to vomit to clear the food. Has to be careful with eating certain foods. History of dysphagia and esophageal dilation when she lived in [place] approximately 3 years ago. Does not have a GI here.   Other providers: dermatologist - has appt in April. Has an eye doctor.   Family history: breast cancer- 2 sister in their 52s.   Social history: Lives with husband and 3 kids, works as  Denies smoking, drinking alcohol, drug use  Diet: healthy  Excerise: tennis 3 times per week.   Immunizations: Tdap 07/2014. Flu shot 10/2016 at [facility].   Health maintenance:  Mammogram: 04/15/2016 Colonoscopy: never Last Pap Smear: 12/2015 Last Menstrual cycle: years  Contraception: mirena  Last Dental Exam: regular visits.  Last Eye Exam: 11/2016  Depression screen [facility] 2/9 12/27/2016 12/04/2015 12/04/2015  Decreased Interest 0 0 0  Down, Depressed, Hopeless 0 0 0  PHQ - 2 Score 0 0 0     Wears seatbelt always, uses sunscreen, smoke detectors in home and functioning, does not text while driving and feels safe in home environment.   Reviewed allergies, medications, past medical, surgical, family, and social history.    Review of Systems Review of Systems Constitutional: -fever, -chills, -sweats, -unexpected weight change,-fatigue ENT: -runny nose, -ear pain, -sore throat Cardiology:  -chest pain, -palpitations, -edema Respiratory: -cough, -shortness of breath, -wheezing Gastroenterology: -abdominal pain, -nausea, -vomiting, -diarrhea, -constipation  Hematology: -bleeding or  bruising problems Musculoskeletal: -arthralgias, -myalgias, -joint swelling, -back pain Ophthalmology: -vision changes Urology: -dysuria, -difficulty urinating, -hematuria, -urinary frequency, -urgency Neurology: -headache, -weakness, -tingling, -numbness       Objective:   Physical Exam BP 104/64   Pulse (!) 57   Ht 5' 5.5" (1.664 m)   Wt 175 lb (79.4 kg)   BMI 28.68 kg/m   General Appearance:    Alert, cooperative, no distress, appears stated age  Head:    Normocephalic, without obvious abnormality, atraumatic  Eyes:    PERRL, conjunctiva/corneas clear, EOM's intact, fundi    benign  Ears:    Normal TM's and external ear canals  Nose:   Nares normal, mucosa normal, no drainage or sinus   tenderness  Throat:   Lips, mucosa, and tongue normal; teeth and gums normal  Neck:   Supple, no lymphadenopathy;  thyroid:  no   enlargement/tenderness/nodules; no carotid   bruit or JVD  Back:    Spine nontender, no curvature, ROM normal, no CVA     tenderness  Lungs:     Clear to auscultation bilaterally without wheezes, rales or     ronchi; respirations unlabored  Chest Wall:    No tenderness or deformity   Heart:    Regular rate and rhythm, S1 and S2 normal, no murmur, rub   or gallop  Breast Exam:    Mammogram ordered.   Abdomen:     Soft, non-tender, nondistended, normoactive bowel sounds,    no masses, no hepatosplenomegaly  Genitalia:    Declined. Pap smear normal and neg HPV last  year.   Rectal:    Normal tone, no masses or tenderness; guaiac negative stool  Extremities:   No clubbing, cyanosis or edema  Pulses:   2+ and symmetric all extremities  Skin:   Skin color, texture, turgor normal, no rashes or lesions  Lymph nodes:   Cervical, supraclavicular, and axillary nodes normal  Neurologic:   CNII-XII intact, normal strength, sensation and gait; reflexes 2+ and symmetric throughout          Psych:   Normal mood, affect, hygiene and grooming.    Urinalysis dipstick: leuk  otherwise normal.      Assessment & Plan:  Routine general medical examination at a health care facility - Plan: Urinalysis Dipstick, CBC with Differential/Platelet, Comprehensive metabolic panel, Lipid panel  Dysphagia, unspecified type - Plan: Ambulatory referral to Gastroenterology  History of esophageal dilatation - Plan: Ambulatory referral to Gastroenterology  Screening for breast cancer - Plan: MM DIGITAL SCREENING BILATERAL  Family history of breast cancer in sister - Plan: MM DIGITAL SCREENING BILATERAL  Discussed that she appears to be doing well. She is eating healthy and exercising. 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  Discussed safety and health promotion.  Up to date with immunizations.  Pap smear normal and neg HPV last year. Not indicated this year.  Has a full skin check scheduled with dermatologist in April.  Discussed option of genetic testing due to 2 sisters with breast cancer in their 30s. She does not seem interested in pursuing this. She will call and schedule her mammogram. Recommend monthly self breast exams.  Referral to GI for evaluation of dysphagia.  Will follow up pending labs and mammogram.

## 2016-12-27 ENCOUNTER — Ambulatory Visit (INDEPENDENT_AMBULATORY_CARE_PROVIDER_SITE_OTHER): Payer: BLUE CROSS/BLUE SHIELD | Admitting: Family Medicine

## 2016-12-27 ENCOUNTER — Encounter: Payer: Self-pay | Admitting: Family Medicine

## 2016-12-27 VITALS — BP 104/64 | HR 57 | Ht 65.5 in | Wt 175.0 lb

## 2016-12-27 DIAGNOSIS — Z0000 Encounter for general adult medical examination without abnormal findings: Secondary | ICD-10-CM

## 2016-12-27 DIAGNOSIS — Z9889 Other specified postprocedural states: Secondary | ICD-10-CM | POA: Diagnosis not present

## 2016-12-27 DIAGNOSIS — R1310 Dysphagia, unspecified: Secondary | ICD-10-CM

## 2016-12-27 DIAGNOSIS — Z1231 Encounter for screening mammogram for malignant neoplasm of breast: Secondary | ICD-10-CM | POA: Diagnosis not present

## 2016-12-27 DIAGNOSIS — Z1239 Encounter for other screening for malignant neoplasm of breast: Secondary | ICD-10-CM

## 2016-12-27 DIAGNOSIS — Z803 Family history of malignant neoplasm of breast: Secondary | ICD-10-CM

## 2016-12-27 LAB — POCT URINALYSIS DIPSTICK
Bilirubin, UA: NEGATIVE
GLUCOSE UA: NEGATIVE
Ketones, UA: NEGATIVE
NITRITE UA: NEGATIVE
PH UA: 7
PROTEIN UA: NEGATIVE
RBC UA: NEGATIVE
SPEC GRAV UA: 1.015
UROBILINOGEN UA: NEGATIVE

## 2016-12-27 LAB — CBC WITH DIFFERENTIAL/PLATELET
Basophils Absolute: 51 cells/uL (ref 0–200)
Basophils Relative: 1 %
Eosinophils Absolute: 153 cells/uL (ref 15–500)
Eosinophils Relative: 3 %
HEMATOCRIT: 40.9 % (ref 35.0–45.0)
Hemoglobin: 13.5 g/dL (ref 11.7–15.5)
LYMPHS PCT: 25 %
Lymphs Abs: 1275 cells/uL (ref 850–3900)
MCH: 30.3 pg (ref 27.0–33.0)
MCHC: 33 g/dL (ref 32.0–36.0)
MCV: 91.7 fL (ref 80.0–100.0)
MONO ABS: 561 {cells}/uL (ref 200–950)
MONOS PCT: 11 %
MPV: 9.1 fL (ref 7.5–12.5)
NEUTROS PCT: 60 %
Neutro Abs: 3060 cells/uL (ref 1500–7800)
PLATELETS: 215 10*3/uL (ref 140–400)
RBC: 4.46 MIL/uL (ref 3.80–5.10)
RDW: 13.5 % (ref 11.0–15.0)
WBC: 5.1 10*3/uL (ref 4.0–10.5)

## 2016-12-27 LAB — COMPREHENSIVE METABOLIC PANEL
ALT: 5 U/L — ABNORMAL LOW (ref 6–29)
AST: 19 U/L (ref 10–35)
Albumin: 4.1 g/dL (ref 3.6–5.1)
Alkaline Phosphatase: 48 U/L (ref 33–115)
BILIRUBIN TOTAL: 0.6 mg/dL (ref 0.2–1.2)
BUN: 11 mg/dL (ref 7–25)
CALCIUM: 8.8 mg/dL (ref 8.6–10.2)
CO2: 26 mmol/L (ref 20–31)
Chloride: 106 mmol/L (ref 98–110)
Creat: 0.72 mg/dL (ref 0.50–1.10)
GLUCOSE: 89 mg/dL (ref 65–99)
POTASSIUM: 4.3 mmol/L (ref 3.5–5.3)
Sodium: 138 mmol/L (ref 135–146)
Total Protein: 6.6 g/dL (ref 6.1–8.1)

## 2016-12-27 LAB — LIPID PANEL
CHOL/HDL RATIO: 2.9 ratio (ref <5.0)
Cholesterol: 146 mg/dL (ref <200)
HDL: 51 mg/dL (ref >50)
LDL CALC: 86 mg/dL (ref <100)
Triglycerides: 45 mg/dL (ref <150)
VLDL: 9 mg/dL (ref <30)

## 2016-12-27 NOTE — Patient Instructions (Addendum)
Call and schedule your mammogram.  GI, Dr. Lorie Apley office will call you to schedule an appointment.  We will call you with lab results.   Preventative Care for Adults - Female      MAINTAIN REGULAR HEALTH EXAMS:  A routine yearly physical is a good way to check in with your primary care provider about your health and preventive screening. It is also an opportunity to share updates about your health and any concerns you have, and receive a thorough all-over exam.   Most health insurance companies pay for at least some preventative services.  Check with your health plan for specific coverages.  WHAT PREVENTATIVE SERVICES DO WOMEN NEED?  Adult women should have their weight and blood pressure checked regularly.   Women age 62 and older should have their cholesterol levels checked regularly.  Women should be screened for cervical cancer with a Pap smear and pelvic exam beginning at either age 43, or 3 years after they become sexually activity.    Breast cancer screening generally begins at age 69 with a mammogram and breast exam by your primary care provider.    Beginning at age 41 and continuing to age 10, women should be screened for colorectal cancer.  Certain people may need continued testing until age 72.  Updating vaccinations is part of preventative care.  Vaccinations help protect against diseases such as the flu.  Osteoporosis is a disease in which the bones lose minerals and strength as we age. Women ages 24 and over should discuss this with their caregivers, as should women after menopause who have other risk factors.  Lab tests are generally done as part of preventative care to screen for anemia and blood disorders, to screen for problems with the kidneys and liver, to screen for bladder problems, to check blood sugar, and to check your cholesterol level.  Preventative services generally include counseling about diet, exercise, avoiding tobacco, drugs, excessive alcohol  consumption, and sexually transmitted infections.    GENERAL RECOMMENDATIONS FOR GOOD HEALTH:  Healthy diet:  Eat a variety of foods, including fruit, vegetables, animal or vegetable protein, such as meat, fish, chicken, and eggs, or beans, lentils, tofu, and grains, such as rice.  Drink plenty of water daily.  Decrease saturated fat in the diet, avoid lots of red meat, processed foods, sweets, fast foods, and fried foods.  Exercise:  Aerobic exercise helps maintain good heart health. At least 30-40 minutes of moderate-intensity exercise is recommended. For example, a brisk walk that increases your heart rate and breathing. This should be done on most days of the week.   Find a type of exercise or a variety of exercises that you enjoy so that it becomes a part of your daily life.  Examples are running, walking, swimming, water aerobics, and biking.  For motivation and support, explore group exercise such as aerobic class, spin class, Zumba, Yoga,or  martial arts, etc.    Set exercise goals for yourself, such as a certain weight goal, walk or run in a race such as a 5k walk/run.  Speak to your primary care provider about exercise goals.  Disease prevention:  If you smoke or chew tobacco, find out from your caregiver how to quit. It can literally save your life, no matter how long you have been a tobacco user. If you do not use tobacco, never begin.   Maintain a healthy diet and normal weight. Increased weight leads to problems with blood pressure and diabetes.   The Body  Mass Index or BMI is a way of measuring how much of your body is fat. Having a BMI above 27 increases the risk of heart disease, diabetes, hypertension, stroke and other problems related to obesity. Your caregiver can help determine your BMI and based on it develop an exercise and dietary program to help you achieve or maintain this important measurement at a healthful level.  High blood pressure causes heart and blood  vessel problems.  Persistent high blood pressure should be treated with medicine if weight loss and exercise do not work.   Fat and cholesterol leaves deposits in your arteries that can block them. This causes heart disease and vessel disease elsewhere in your body.  If your cholesterol is found to be high, or if you have heart disease or certain other medical conditions, then you may need to have your cholesterol monitored frequently and be treated with medication.   Ask if you should have a cardiac stress test if your history suggests this. A stress test is a test done on a treadmill that looks for heart disease. This test can find disease prior to there being a problem.  Menopause can be associated with physical symptoms and risks. Hormone replacement therapy is available to decrease these. You should talk to your caregiver about whether starting or continuing to take hormones is right for you.   Osteoporosis is a disease in which the bones lose minerals and strength as we age. This can result in serious bone fractures. Risk of osteoporosis can be identified using a bone density scan. Women ages 25 and over should discuss this with their caregivers, as should women after menopause who have other risk factors. Ask your caregiver whether you should be taking a calcium supplement and Vitamin D, to reduce the rate of osteoporosis.   Avoid drinking alcohol in excess (more than one drink per day for women).  Avoid use of street drugs. Do not share needles with anyone. Ask for professional help if you need assistance or instructions on stopping the use of alcohol, cigarettes, and/or drugs.  Brush your teeth twice a day with fluoride toothpaste, and floss once a day. Good oral hygiene prevents tooth decay and gum disease. The problems can be painful, unattractive, and can cause other health problems. Visit your dentist for a routine oral and dental check up and preventive care every 6-12 months.   Look at  your skin regularly.  Use a mirror to look at your back. Notify your caregivers of changes in moles, especially if there are changes in shapes, colors, a size larger than a pencil eraser, an irregular border, or development of new moles.  Safety:  Use seatbelts 100% of the time, whether driving or as a passenger.  Use safety devices such as hearing protection if you work in environments with loud noise or significant background noise.  Use safety glasses when doing any work that could send debris in to the eyes.  Use a helmet if you ride a bike or motorcycle.  Use appropriate safety gear for contact sports.  Talk to your caregiver about gun safety.  Use sunscreen with a SPF (or skin protection factor) of 15 or greater.  Lighter skinned people are at a greater risk of skin cancer. Don't forget to also wear sunglasses in order to protect your eyes from too much damaging sunlight. Damaging sunlight can accelerate cataract formation.   Practice safe sex. Use condoms. Condoms are used for birth control and to help reduce the  spread of sexually transmitted infections (or STIs).  Some of the STIs are gonorrhea (the clap), chlamydia, syphilis, trichomonas, herpes, HPV (human papilloma virus) and HIV (human immunodeficiency virus) which causes AIDS. The herpes, HIV and HPV are viral illnesses that have no cure. These can result in disability, cancer and death.   Keep carbon monoxide and smoke detectors in your home functioning at all times. Change the batteries every 6 months or use a model that plugs into the wall.   Vaccinations:  Stay up to date with your tetanus shots and other required immunizations. You should have a booster for tetanus every 10 years. Be sure to get your flu shot every year, since 5%-20% of the [place] population comes down with the flu. The flu vaccine changes each year, so being vaccinated once is not enough. Get your shot in the fall, before the flu season peaks.   Other vaccines to  consider:  Human Papilloma Virus or HPV causes cancer of the cervix, and other infections that can be transmitted from person to person. There is a vaccine for HPV, and females should get immunized between the ages of 58 and 53. It requires a series of 3 shots.   Pneumococcal vaccine to protect against certain types of pneumonia.  This is normally recommended for adults age 32 or older.  However, adults younger than 48 years old with certain underlying conditions such as diabetes, heart or lung disease should also receive the vaccine.  Shingles vaccine to protect against Varicella Zoster if you are older than age 88, or younger than 48 years old with certain underlying illness.  Hepatitis A vaccine to protect against a form of infection of the liver by a virus acquired from food.  Hepatitis B vaccine to protect against a form of infection of the liver by a virus acquired from blood or body fluids, particularly if you work in health care.  If you plan to travel internationally, check with your local health department for specific vaccination recommendations.  Cancer Screening:  Breast cancer screening is essential to preventive care for women. All women age 53 and older should perform a breast self-exam every month. At age 64 and older, women should have their caregiver complete a breast exam each year. Women at ages 14 and older should have a mammogram (x-ray film) of the breasts. Your caregiver can discuss how often you need mammograms.    Cervical cancer screening includes taking a Pap smear (sample of cells examined under a microscope) from the cervix (end of the uterus). It also includes testing for HPV (Human Papilloma Virus, which can cause cervical cancer). Screening and a pelvic exam should begin at age 83, or 3 years after a woman becomes sexually active. Screening should occur every year, with a Pap smear but no HPV testing, up to age 13. After age 20, you should have a Pap smear every 3  years with HPV testing, if no HPV was found previously.   Most routine colon cancer screening begins at the age of 74. On a yearly basis, doctors may provide special easy to use take-home tests to check for hidden blood in the stool. Sigmoidoscopy or colonoscopy can detect the earliest forms of colon cancer and is life saving. These tests use a small camera at the end of a tube to directly examine the colon. Speak to your caregiver about this at age 69, when routine screening begins (and is repeated every 5 years unless early forms of pre-cancerous polyps or  small growths are found).

## 2017-02-18 DIAGNOSIS — L720 Epidermal cyst: Secondary | ICD-10-CM | POA: Diagnosis not present

## 2017-02-18 DIAGNOSIS — D2261 Melanocytic nevi of right upper limb, including shoulder: Secondary | ICD-10-CM | POA: Diagnosis not present

## 2017-02-18 DIAGNOSIS — D1801 Hemangioma of skin and subcutaneous tissue: Secondary | ICD-10-CM | POA: Diagnosis not present

## 2017-02-18 DIAGNOSIS — D225 Melanocytic nevi of trunk: Secondary | ICD-10-CM | POA: Diagnosis not present

## 2017-04-12 DIAGNOSIS — M9903 Segmental and somatic dysfunction of lumbar region: Secondary | ICD-10-CM | POA: Diagnosis not present

## 2017-04-12 DIAGNOSIS — M7701 Medial epicondylitis, right elbow: Secondary | ICD-10-CM | POA: Diagnosis not present

## 2017-04-12 DIAGNOSIS — M7711 Lateral epicondylitis, right elbow: Secondary | ICD-10-CM | POA: Diagnosis not present

## 2017-04-12 DIAGNOSIS — M9901 Segmental and somatic dysfunction of cervical region: Secondary | ICD-10-CM | POA: Diagnosis not present

## 2017-04-25 ENCOUNTER — Ambulatory Visit
Admission: RE | Admit: 2017-04-25 | Discharge: 2017-04-25 | Disposition: A | Discharge status: Home / Self Care | Payer: BLUE CROSS/BLUE SHIELD | Source: Ambulatory Visit | Attending: Family Medicine | Admitting: Family Medicine

## 2017-04-25 DIAGNOSIS — Z1239 Encounter for other screening for malignant neoplasm of breast: Secondary | ICD-10-CM

## 2017-04-25 DIAGNOSIS — Z1231 Encounter for screening mammogram for malignant neoplasm of breast: Secondary | ICD-10-CM | POA: Diagnosis not present

## 2017-04-25 DIAGNOSIS — Z803 Family history of malignant neoplasm of breast: Secondary | ICD-10-CM

## 2017-04-26 DIAGNOSIS — M7711 Lateral epicondylitis, right elbow: Secondary | ICD-10-CM | POA: Diagnosis not present

## 2017-04-26 DIAGNOSIS — M9903 Segmental and somatic dysfunction of lumbar region: Secondary | ICD-10-CM | POA: Diagnosis not present

## 2017-04-26 DIAGNOSIS — M9901 Segmental and somatic dysfunction of cervical region: Secondary | ICD-10-CM | POA: Diagnosis not present

## 2017-04-26 DIAGNOSIS — M7701 Medial epicondylitis, right elbow: Secondary | ICD-10-CM | POA: Diagnosis not present

## 2017-05-23 DIAGNOSIS — M7701 Medial epicondylitis, right elbow: Secondary | ICD-10-CM | POA: Diagnosis not present

## 2017-05-23 DIAGNOSIS — M9903 Segmental and somatic dysfunction of lumbar region: Secondary | ICD-10-CM | POA: Diagnosis not present

## 2017-05-23 DIAGNOSIS — M9901 Segmental and somatic dysfunction of cervical region: Secondary | ICD-10-CM | POA: Diagnosis not present

## 2017-05-23 DIAGNOSIS — M7711 Lateral epicondylitis, right elbow: Secondary | ICD-10-CM | POA: Diagnosis not present

## 2017-08-09 DIAGNOSIS — M7701 Medial epicondylitis, right elbow: Secondary | ICD-10-CM | POA: Diagnosis not present

## 2017-08-09 DIAGNOSIS — M7711 Lateral epicondylitis, right elbow: Secondary | ICD-10-CM | POA: Diagnosis not present

## 2017-08-09 DIAGNOSIS — M9903 Segmental and somatic dysfunction of lumbar region: Secondary | ICD-10-CM | POA: Diagnosis not present

## 2017-08-09 DIAGNOSIS — M9901 Segmental and somatic dysfunction of cervical region: Secondary | ICD-10-CM | POA: Diagnosis not present

## 2017-08-11 DIAGNOSIS — M7711 Lateral epicondylitis, right elbow: Secondary | ICD-10-CM | POA: Diagnosis not present

## 2017-08-11 DIAGNOSIS — M9901 Segmental and somatic dysfunction of cervical region: Secondary | ICD-10-CM | POA: Diagnosis not present

## 2017-08-11 DIAGNOSIS — M9903 Segmental and somatic dysfunction of lumbar region: Secondary | ICD-10-CM | POA: Diagnosis not present

## 2017-08-11 DIAGNOSIS — M7701 Medial epicondylitis, right elbow: Secondary | ICD-10-CM | POA: Diagnosis not present

## 2017-08-15 DIAGNOSIS — M7711 Lateral epicondylitis, right elbow: Secondary | ICD-10-CM | POA: Diagnosis not present

## 2017-08-15 DIAGNOSIS — M7701 Medial epicondylitis, right elbow: Secondary | ICD-10-CM | POA: Diagnosis not present

## 2017-08-15 DIAGNOSIS — M9901 Segmental and somatic dysfunction of cervical region: Secondary | ICD-10-CM | POA: Diagnosis not present

## 2017-08-15 DIAGNOSIS — M9903 Segmental and somatic dysfunction of lumbar region: Secondary | ICD-10-CM | POA: Diagnosis not present

## 2017-08-18 DIAGNOSIS — M9903 Segmental and somatic dysfunction of lumbar region: Secondary | ICD-10-CM | POA: Diagnosis not present

## 2017-08-18 DIAGNOSIS — M7701 Medial epicondylitis, right elbow: Secondary | ICD-10-CM | POA: Diagnosis not present

## 2017-08-18 DIAGNOSIS — M9901 Segmental and somatic dysfunction of cervical region: Secondary | ICD-10-CM | POA: Diagnosis not present

## 2017-08-18 DIAGNOSIS — M7711 Lateral epicondylitis, right elbow: Secondary | ICD-10-CM | POA: Diagnosis not present

## 2017-08-23 DIAGNOSIS — M9903 Segmental and somatic dysfunction of lumbar region: Secondary | ICD-10-CM | POA: Diagnosis not present

## 2017-08-23 DIAGNOSIS — M7701 Medial epicondylitis, right elbow: Secondary | ICD-10-CM | POA: Diagnosis not present

## 2017-08-23 DIAGNOSIS — M9901 Segmental and somatic dysfunction of cervical region: Secondary | ICD-10-CM | POA: Diagnosis not present

## 2017-08-23 DIAGNOSIS — M7711 Lateral epicondylitis, right elbow: Secondary | ICD-10-CM | POA: Diagnosis not present

## 2017-09-07 DIAGNOSIS — M9903 Segmental and somatic dysfunction of lumbar region: Secondary | ICD-10-CM | POA: Diagnosis not present

## 2017-09-07 DIAGNOSIS — M7711 Lateral epicondylitis, right elbow: Secondary | ICD-10-CM | POA: Diagnosis not present

## 2017-09-07 DIAGNOSIS — M9901 Segmental and somatic dysfunction of cervical region: Secondary | ICD-10-CM | POA: Diagnosis not present

## 2017-09-07 DIAGNOSIS — M7701 Medial epicondylitis, right elbow: Secondary | ICD-10-CM | POA: Diagnosis not present

## 2017-09-13 DIAGNOSIS — M9903 Segmental and somatic dysfunction of lumbar region: Secondary | ICD-10-CM | POA: Diagnosis not present

## 2017-09-13 DIAGNOSIS — M7711 Lateral epicondylitis, right elbow: Secondary | ICD-10-CM | POA: Diagnosis not present

## 2017-09-13 DIAGNOSIS — M9901 Segmental and somatic dysfunction of cervical region: Secondary | ICD-10-CM | POA: Diagnosis not present

## 2017-09-13 DIAGNOSIS — M7701 Medial epicondylitis, right elbow: Secondary | ICD-10-CM | POA: Diagnosis not present

## 2017-09-30 DIAGNOSIS — L720 Epidermal cyst: Secondary | ICD-10-CM | POA: Diagnosis not present

## 2017-10-21 DIAGNOSIS — H5203 Hypermetropia, bilateral: Secondary | ICD-10-CM | POA: Diagnosis not present

## 2018-01-08 NOTE — Progress Notes (Deleted)
Subjective:    Patient ID: Erica David, female    DOB: 10/11/1971, 47 y.o.   MRN: [id number]  HPI No chief complaint on file.  She is new to the practice and here for a complete physical exam. Previous medical care: Last CPE:  Other providers:  Past medical history: Surgeries:  Family history: Mental Health History:  Social history: She lives with husband and has 3 kids. She denies smoking and drug use. Drinks occasionally.  works as ***,  Diet: *** Excerise: ***  Immunizations: Tdap 2015  Health maintenance:  Mammogram: 04/25/2017 Colonoscopy: Last Gynecological Exam: Pap smear due in 2020 Last Menstrual cycle: IUD scheduled to be discontinued in 2020 Pregnancies:  Last Dental Exam: Last Eye Exam:   Wears seatbelt always, uses sunscreen, smoke detectors in home and functioning, does not text while driving and feels safe in home environment.   Reviewed allergies, medications, past medical, surgical, family, and social history.     Review of Systems Review of Systems Constitutional: -fever, -chills, -sweats, -unexpected weight change,-fatigue ENT: -runny nose, -ear pain, -sore throat Cardiology:  -chest pain, -palpitations, -edema Respiratory: -cough, -shortness of breath, -wheezing Gastroenterology: -abdominal pain, -nausea, -vomiting, -diarrhea, -constipation  Hematology: -bleeding or bruising problems Musculoskeletal: -arthralgias, -myalgias, -joint swelling, -back pain Ophthalmology: -vision changes Urology: -dysuria, -difficulty urinating, -hematuria, -urinary frequency, -urgency Neurology: -headache, -weakness, -tingling, -numbness       Objective:   Physical Exam There were no vitals taken for this visit.  General Appearance:    Alert, cooperative, no distress, appears stated age  Head:    Normocephalic, without obvious abnormality, atraumatic  Eyes:    PERRL, conjunctiva/corneas clear, EOM's intact, fundi    benign  Ears:    Normal TM's  and external ear canals  Nose:   Nares normal, mucosa normal, no drainage or sinus   tenderness  Throat:   Lips, mucosa, and tongue normal; teeth and gums normal  Neck:   Supple, no lymphadenopathy;  thyroid:  no   enlargement/tenderness/nodules; no carotid   bruit or JVD  Back:    Spine nontender, no curvature, ROM normal, no CVA     tenderness  Lungs:     Clear to auscultation bilaterally without wheezes, rales or     ronchi; respirations unlabored  Chest Wall:    No tenderness or deformity   Heart:    Regular rate and rhythm, S1 and S2 normal, no murmur, rub   or gallop  Breast Exam:    No tenderness, masses, or nipple discharge or inversion.      No axillary lymphadenopathy  Abdomen:     Soft, non-tender, nondistended, normoactive bowel sounds,    no masses, no hepatosplenomegaly  Genitalia:    Normal external genitalia without lesions.  BUS and vagina normal; cervix without lesions, or cervical motion tenderness. No abnormal vaginal discharge.  Uterus and adnexa not enlarged, nontender, no masses.  Pap performed  Rectal:    Normal tone, no masses or tenderness; guaiac negative stool  Extremities:   No clubbing, cyanosis or edema  Pulses:   2+ and symmetric all extremities  Skin:   Skin color, texture, turgor normal, no rashes or lesions  Lymph nodes:   Cervical, supraclavicular, and axillary nodes normal  Neurologic:   CNII-XII intact, normal strength, sensation and gait; reflexes 2+ and symmetric throughout          Psych:   Normal mood, affect, hygiene and grooming.    Urinalysis dipstick:  Assessment & Plan:  Routine general medical examination at a health care facility

## 2018-01-09 ENCOUNTER — Encounter: Payer: Self-pay | Admitting: Family Medicine

## 2018-04-13 NOTE — Progress Notes (Signed)
Subjective:    Patient ID: Erica David, female    DOB: 02-Oct-1971, 47 y.o.   MRN: [id number]  HPI Chief Complaint  Patient presents with  . fasting cpe    fasting cpe. had eye exam done within a year   She is here for a complete physical exam.  States she continues having difficulty swallowing certain foods. History of esophageal dilation. Does not have a GI here. She is from [place]. Referred her last year but she was unable to schedule to be seen. She would like a new referral and plans to see GI for this issue.   Sisters with breast cancer and would like to get mammogram. This is due.   She has a skin tag on her right breast that she would like to have removed.   She will be due for pap smear next year and due to have her IUD removed as well. Will give her a list of OB/GYNs.   Other providers: Dermatologist- in [place]  OB/GYN in [place]    Social history: Lives with her husband and 3 children, works as an Optometrist  Denies smoking, drinking alcohol, drug use  Diet: fairly healthy  Excerise: not lately but does play tennis and plans to get back to her gym.   Immunizations: up to date   Health maintenance:  Mammogram: 04/2017 Colonoscopy: never  Last Gynecological Exam: pap smear normal and HPV negative in 12/2015 Last Menstrual cycle: IUD  Last Dental Exam: one week ago  Last Eye Exam: December 2018  Wears seatbelt always, uses sunscreen, smoke detectors in home and functioning, does not text while driving and feels safe in home environment.   Reviewed allergies, medications, past medical, surgical, family, and social history.   Review of Systems Review of Systems Constitutional: -fever, -chills, -sweats, -unexpected weight change,-fatigue ENT: -runny nose, -ear pain, -sore throat Cardiology:  -chest pain, -palpitations, -edema Respiratory: -cough, -shortness of breath, -wheezing Gastroenterology: -abdominal pain, -nausea, -vomiting, -diarrhea,  -constipation  Hematology: -bleeding or bruising problems Musculoskeletal: -arthralgias, -myalgias, -joint swelling, -back pain Ophthalmology: -vision changes Urology: -dysuria, -difficulty urinating, -hematuria, -urinary frequency, -urgency Neurology: -headache, -weakness, -tingling, -numbness       Objective:   Physical Exam BP 120/60   Pulse 67   Ht 5' 3.25" (1.607 m)   Wt 183 lb (83 kg)   BMI 32.16 kg/m   General Appearance:    Alert, cooperative, no distress, appears stated age  Head:    Normocephalic, without obvious abnormality, atraumatic  Eyes:    PERRL, conjunctiva/corneas clear, EOM's intact, fundi    benign  Ears:    Normal TM's and external ear canals  Nose:   Nares normal, mucosa normal, no drainage or sinus   tenderness  Throat:   Lips, mucosa, and tongue normal; teeth and gums normal  Neck:   Supple, no lymphadenopathy;  thyroid:  no   enlargement/tenderness/nodules; no carotid   bruit or JVD  Back:    Spine nontender, no curvature, ROM normal, no CVA     tenderness  Lungs:     Clear to auscultation bilaterally without wheezes, rales or     ronchi; respirations unlabored  Chest Wall:    No tenderness or deformity   Heart:    Regular rate and rhythm, S1 and S2 normal, no murmur, rub   or gallop  Breast Exam:    Not done. Mammogram ordered. Skin tag on upper right breast.   Abdomen:     Soft, non-tender, nondistended,  normoactive bowel sounds,    no masses, no hepatosplenomegaly  Genitalia:    Normal external genitalia without lesions.  BUS and vagina normal; cervix without lesions, or cervical motion tenderness. No abnormal vaginal discharge.  Uterus and adnexa not enlarged, nontender, no masses.  Pap not indicated.     Extremities:   No clubbing, cyanosis or edema  Pulses:   2+ and symmetric all extremities  Skin:   Skin color, texture, turgor normal, no rashes or lesions  Lymph nodes:   Cervical, supraclavicular, and axillary nodes normal  Neurologic:    CNII-XII intact, normal strength, sensation and gait; reflexes 2+ and symmetric throughout          Psych:   Normal mood, affect, hygiene and grooming.    Urinalysis dipstick: trace leuks otherwise negative. asymtomatic      Assessment & Plan:  Routine general medical examination at a health care facility - Plan: POCT Urinalysis DIP (Proadvantage Device), CANCELED: POCT Urinalysis DIP (Proadvantage Device)  Screening for breast cancer - Plan: MM DIGITAL SCREENING BILATERAL  Skin tag  Dysphagia, unspecified type - Plan: Ambulatory referral to Gastroenterology  She appears to be doing well overall.  Just returned from a 67-month stay in [place] which was much longer than she intended. Reviewed lab results from the past 2 years and all of her blood work was fine including cholesterol.  Discussed option of skipping this year with blood work since it really is not indicated.  She is asymptomatic.  new referral to GI for dysphasia and history of esophageal dilation.  Also discussed new colon cancer screening guidelines starting age 81 and she will discuss this with her GI. Mammogram ordered.  She does have a significant family history of breast cancer in both sisters. Skin tag on the right breast was removed.  Area was cleaned with alcohol and Betadine.  Sterile scissors used to remove the skin tag, no bleeding.  Patient tolerated this well. She will be due for Pap smear next year.  She will also be due to have her Mirena IUD removed.  A list of OB/GYN offices was provided. Discussed healthy lifestyle with diet and exercise. Follow-up in 1 year or sooner if needed.

## 2018-04-14 ENCOUNTER — Encounter: Payer: Self-pay | Admitting: Family Medicine

## 2018-04-14 ENCOUNTER — Ambulatory Visit: Payer: BLUE CROSS/BLUE SHIELD | Admitting: Family Medicine

## 2018-04-14 VITALS — BP 120/60 | HR 67 | Ht 63.25 in | Wt 183.0 lb

## 2018-04-14 DIAGNOSIS — Z0000 Encounter for general adult medical examination without abnormal findings: Secondary | ICD-10-CM

## 2018-04-14 DIAGNOSIS — Z1231 Encounter for screening mammogram for malignant neoplasm of breast: Secondary | ICD-10-CM | POA: Diagnosis not present

## 2018-04-14 DIAGNOSIS — R1310 Dysphagia, unspecified: Secondary | ICD-10-CM | POA: Diagnosis not present

## 2018-04-14 DIAGNOSIS — L918 Other hypertrophic disorders of the skin: Secondary | ICD-10-CM

## 2018-04-14 DIAGNOSIS — Z1239 Encounter for other screening for malignant neoplasm of breast: Secondary | ICD-10-CM

## 2018-04-14 LAB — POCT URINALYSIS DIP (PROADVANTAGE DEVICE)
Bilirubin, UA: NEGATIVE
Blood, UA: NEGATIVE
GLUCOSE UA: NEGATIVE mg/dL
Ketones, POC UA: NEGATIVE mg/dL
NITRITE UA: NEGATIVE
Protein Ur, POC: NEGATIVE mg/dL
Specific Gravity, Urine: 1.025
UUROB: NEGATIVE
pH, UA: 6 (ref 5.0–8.0)

## 2018-04-14 NOTE — Patient Instructions (Addendum)
Call and schedule your mammogram.   The GI office will call you to schedule.   Preventative Care for Adults - Female   Obgyn Offices:   [place], [place] [address] Phone: [place] [address] [place], [place] [phone]  [facility] Address: [address]                   [place], [place] [address] Phone: [phone]  [place] [address] [place], [place] [address] Phone: [phone]             [place]:  A routine yearly physical is a good way to check in with your primary care provider about your health and preventive screening. It is also an opportunity to share updates about your health and any concerns you have, and receive a thorough all-over exam.   Most health insurance companies pay for at least some preventative services.  Check with your health plan for specific coverages.  WHAT PREVENTATIVE SERVICES DO WOMEN NEED?  Adult women should have their weight and blood pressure checked regularly.   Women age 55 and older should have their cholesterol levels checked regularly.  Women should be screened for cervical cancer with a Pap smear and pelvic exam beginning at age 56.  Breast cancer screening generally begins at age 31 with a mammogram and breast exam by your primary care provider.    Beginning at age 51 and continuing to age 7, women should be screened for colorectal cancer.  Certain people may need continued testing until age 46.  Updating vaccinations is part of preventative care.  Vaccinations help protect against diseases such as the flu.  Osteoporosis is a disease in which the bones lose minerals and strength as we age. Women ages 42 and over should discuss this with their caregivers, as should women after menopause who have other risk factors.  Lab tests are  generally done as part of preventative care to screen for anemia and blood disorders, to screen for problems with the kidneys and liver, to screen for bladder problems, to check blood sugar, and to check your cholesterol level.  Preventative services generally include counseling about diet, exercise, avoiding tobacco, drugs, excessive alcohol consumption, and sexually transmitted infections.    GENERAL RECOMMENDATIONS FOR GOOD HEALTH:  Healthy diet:  Eat a variety of foods, including fruit, vegetables, animal or vegetable protein, such as meat, fish, chicken, and eggs, or beans, lentils, tofu, and grains, such as rice.  Drink plenty of water daily.  Decrease saturated fat in the diet, avoid lots of red meat, processed foods, sweets, fast foods, and fried foods.  Exercise:  Aerobic exercise helps maintain good heart health. At least 30-40 minutes of moderate-intensity exercise is recommended. For example, a brisk walk that increases your heart rate and breathing. This should be done on most days of the week.   Find a type of exercise or a variety of exercises that you enjoy so that it becomes a part of your daily life.  Examples are running, walking, swimming, water aerobics, and biking.  For motivation and support, explore group exercise such as aerobic class, spin class, Zumba, Yoga,or  martial arts, etc.    Set exercise goals for yourself, such as a certain weight goal, walk or run in a race such as a 5k walk/run.  Speak to your primary care provider  about exercise goals.  Disease prevention:  If you smoke or chew tobacco, find out from your caregiver how to quit. It can literally save your life, no matter how long you have been a tobacco user. If you do not use tobacco, never begin.   Maintain a healthy diet and normal weight. Increased weight leads to problems with blood pressure and diabetes.   The Body Mass Index or BMI is a way of measuring how much of your body is fat. Having a  BMI above 27 increases the risk of heart disease, diabetes, hypertension, stroke and other problems related to obesity. Your caregiver can help determine your BMI and based on it develop an exercise and dietary program to help you achieve or maintain this important measurement at a healthful level.  High blood pressure causes heart and blood vessel problems.  Persistent high blood pressure should be treated with medicine if weight loss and exercise do not work.   Fat and cholesterol leaves deposits in your arteries that can block them. This causes heart disease and vessel disease elsewhere in your body.  If your cholesterol is found to be high, or if you have heart disease or certain other medical conditions, then you may need to have your cholesterol monitored frequently and be treated with medication.   Ask if you should have a cardiac stress test if your history suggests this. A stress test is a test done on a treadmill that looks for heart disease. This test can find disease prior to there being a problem.  Menopause can be associated with physical symptoms and risks. Hormone replacement therapy is available to decrease these. You should talk to your caregiver about whether starting or continuing to take hormones is right for you.   Osteoporosis is a disease in which the bones lose minerals and strength as we age. This can result in serious bone fractures. Risk of osteoporosis can be identified using a bone density scan. Women ages 38 and over should discuss this with their caregivers, as should women after menopause who have other risk factors. Ask your caregiver whether you should be taking a calcium supplement and Vitamin D, to reduce the rate of osteoporosis.   Avoid drinking alcohol in excess (more than two drinks per day).  Avoid use of street drugs. Do not share needles with anyone. Ask for professional help if you need assistance or instructions on stopping the use of alcohol, cigarettes,  and/or drugs.  Brush your teeth twice a day with fluoride toothpaste, and floss once a day. Good oral hygiene prevents tooth decay and gum disease. The problems can be painful, unattractive, and can cause other health problems. Visit your dentist for a routine oral and dental check up and preventive care every 6-12 months.   Look at your skin regularly.  Use a mirror to look at your back. Notify your caregivers of changes in moles, especially if there are changes in shapes, colors, a size larger than a pencil eraser, an irregular border, or development of new moles.  Safety:  Use seatbelts 100% of the time, whether driving or as a passenger.  Use safety devices such as hearing protection if you work in environments with loud noise or significant background noise.  Use safety glasses when doing any work that could send debris in to the eyes.  Use a helmet if you ride a bike or motorcycle.  Use appropriate safety gear for contact sports.  Talk to your caregiver about gun safety.  Use sunscreen with a SPF (or skin protection factor) of 15 or greater.  Lighter skinned people are at a greater risk of skin cancer. Don't forget to also wear sunglasses in order to protect your eyes from too much damaging sunlight. Damaging sunlight can accelerate cataract formation.   Practice safe sex. Use condoms. Condoms are used for birth control and to help reduce the spread of sexually transmitted infections (or STIs).  Some of the STIs are gonorrhea (the clap), chlamydia, syphilis, trichomonas, herpes, HPV (human papilloma virus) and HIV (human immunodeficiency virus) which causes AIDS. The herpes, HIV and HPV are viral illnesses that have no cure. These can result in disability, cancer and death.   Keep carbon monoxide and smoke detectors in your home functioning at all times. Change the batteries every 6 months or use a model that plugs into the wall.   Vaccinations:  Stay up to date with your tetanus shots and  other required immunizations. You should have a booster for tetanus every 10 years. Be sure to get your flu shot every year, since 5%-20% of the [place] population comes down with the flu. The flu vaccine changes each year, so being vaccinated once is not enough. Get your shot in the fall, before the flu season peaks.   Other vaccines to consider:  Human Papilloma Virus or HPV causes cancer of the cervix, and other infections that can be transmitted from person to person. There is a vaccine for HPV, and females should get immunized between the ages of 77 and 46. It requires a series of 3 shots.   Pneumococcal vaccine to protect against certain types of pneumonia.  This is normally recommended for adults age 105 or older.  However, adults younger than 49 years old with certain underlying conditions such as diabetes, heart or lung disease should also receive the vaccine.  Shingles vaccine to protect against Varicella Zoster if you are older than age 63, or younger than 49 years old with certain underlying illness.  Hepatitis A vaccine to protect against a form of infection of the liver by a virus acquired from food.  Hepatitis B vaccine to protect against a form of infection of the liver by a virus acquired from blood or body fluids, particularly if you work in health care.  If you plan to travel internationally, check with your local health department for specific vaccination recommendations.  Cancer Screening:  Breast cancer screening is essential to preventive care for women. All women age 39 and older should perform a breast self-exam every month. At age 90 and older, women should have their caregiver complete a breast exam each year. Women at ages 38 and older should have a mammogram (x-ray film) of the breasts. Your caregiver can discuss how often you need mammograms.    Cervical cancer screening includes taking a Pap smear (sample of cells examined under a microscope) from the cervix (end of the  uterus). It also includes testing for HPV (Human Papilloma Virus, which can cause cervical cancer). Screening and a pelvic exam should begin at age 55.  Screening should occur every year, with a Pap smear but no HPV testing, up to age 41. After age 64, you should have a Pap smear every 3 years with HPV testing, if no HPV was found previously.   Most routine colon cancer screening begins at the age of 68. On a yearly basis, doctors may provide special easy to use take-home tests to check for hidden blood in the stool.  Sigmoidoscopy or colonoscopy can detect the earliest forms of colon cancer and is life saving. These tests use a small camera at the end of a tube to directly examine the colon. Speak to your caregiver about this at age 49, when routine screening begins (and is repeated every 5 years unless early forms of pre-cancerous polyps or small growths are found).

## 2018-04-19 ENCOUNTER — Encounter: Payer: Self-pay | Admitting: Internal Medicine

## 2018-05-08 ENCOUNTER — Ambulatory Visit
Admission: RE | Admit: 2018-05-08 | Discharge: 2018-05-08 | Disposition: A | Discharge status: Home / Self Care | Payer: BLUE CROSS/BLUE SHIELD | Source: Ambulatory Visit | Attending: Family Medicine | Admitting: Family Medicine

## 2018-05-08 DIAGNOSIS — Z1231 Encounter for screening mammogram for malignant neoplasm of breast: Secondary | ICD-10-CM | POA: Diagnosis not present

## 2018-05-08 DIAGNOSIS — Z1239 Encounter for other screening for malignant neoplasm of breast: Secondary | ICD-10-CM

## 2018-05-26 ENCOUNTER — Encounter: Payer: Self-pay | Admitting: Family Medicine

## 2018-05-26 ENCOUNTER — Encounter: Payer: Self-pay | Admitting: Gastroenterology

## 2018-07-21 ENCOUNTER — Encounter: Payer: Self-pay | Admitting: Gastroenterology

## 2018-07-21 ENCOUNTER — Ambulatory Visit: Payer: BLUE CROSS/BLUE SHIELD | Admitting: Gastroenterology

## 2018-07-21 VITALS — BP 122/80 | HR 58 | Ht 65.0 in | Wt 188.6 lb

## 2018-07-21 DIAGNOSIS — Z9889 Other specified postprocedural states: Secondary | ICD-10-CM | POA: Diagnosis not present

## 2018-07-21 DIAGNOSIS — K2289 Other specified disease of esophagus: Secondary | ICD-10-CM

## 2018-07-21 DIAGNOSIS — R1319 Other dysphagia: Secondary | ICD-10-CM

## 2018-07-21 DIAGNOSIS — K228 Other specified diseases of esophagus: Secondary | ICD-10-CM | POA: Diagnosis not present

## 2018-07-21 DIAGNOSIS — R1310 Dysphagia, unspecified: Secondary | ICD-10-CM | POA: Diagnosis not present

## 2018-07-21 NOTE — Patient Instructions (Addendum)
Normal BMI (Body Mass Index- based on height and weight) is between 19 and 25. Your BMI today is Body mass index is 31.38 kg/m. Erica David Please consider follow up  regarding your BMI with your Primary Care Provider.   You have been scheduled for an endoscopy. Please follow written instructions given to you at your visit today. If you use inhalers (even only as needed), please bring them with you on the day of your procedure. Your physician has requested that you go to www.startemmi.com and enter the access code given to you at your visit today. This web site gives a general overview about your procedure. However, you should still follow specific instructions given to you by our office regarding your preparation for the procedure.  Patient will follow up on 09/01/18 at 130pm

## 2018-07-21 NOTE — Progress Notes (Signed)
[place] VISIT   Primary Care Provider Girtha Rm, NP-C [place] [id number] [phone]  Referring Provider Girtha Rm, NP-C [place],  [address] [phone]  Patient Profile: Erica David is a 47 y.o. female with a pmh significant for Esophageal Food Impaction s/p EGD with Dilation.  The patient presents to the [facility] for an evaluation and management of problem(s) noted below:  Problem List 1. Esophageal dysphagia   2. History of esophagogastroduodenoscopy (EGD)   3. Dilation of esophagus     History of Present Illness: This is the patient's first visit to the GI [place] clinic.  She is originally from [place].  She describes a prior history approximately 6 years ago around December of presenting with an acute food impaction.  She required an urgent endoscopy.  A few weeks later she had a repeat endoscopy and reports an esophageal dilation.  She does not recall whether the esophagus was biopsied previously.  After the dilation she did well and did not follow-up with GI since symptoms were under control.  She has been very mindful of eating slowly and chewing her food appropriately.  She eats small bites to try and minimize issues.  She has no history of prior allergies or asthma as a child or adult.  However after a year or 2 she began to have recurrent episodes of sensation of food bolus impactions.  She never got to the point of requiring repeat endoscopy but in the course of the year she may have 4 episodes where she feels something getting caught in the midsternum of her chest she then stops eating takes deep breaths and then with liquid is able to try and pass the foodstuffs.  If this does not occur she has to regurgitate and vomit the food.  She has not had any hematemesis or coffee-ground emesis with any of her episodes.  She does not take nonsteroidals or BC/Goody  powders.  When probing the patient she states that she has had heartburn very, very infrequently.  She does not have indigestion or abdominal pain.  She has no nausea except at the time of these impaction-like events.  She is never had a colonoscopy.  GI Review of Systems Positive as above Negative for odynophagia, globus, change in appetite, early satiety, bloating, change in bowel habits, melena, hematochezia  Review of Systems General: Denies fevers/chills/weight loss HEENT: Denies oral lesions Cardiovascular: Only has chest discomfort at time of these impactions otherwise is very healthy without chest discomforts/pains Pulmonary: Denies shortness of breath Gastroenterological: See HPI Genitourinary: Denies hematuria or darkened urine Hematological: Denies easy bruising Dermatological: Denies skin changes Psychological: Mood is stable Musculoskeletal: Denies new arthralgias   Medications Current Outpatient Medications  Medication Sig Dispense Refill  . levonorgestrel (MIRENA) 20 MCG/24HR IUD by Intrauterine route.     No current facility-administered medications for this visit.     Allergies No Known Allergies  Histories Past Medical History:  Diagnosis Date  . Epicondylitis elbow, medial   . Esophageal stricture    Past Surgical History:  Procedure Laterality Date  .  endoscopy with dilation    . UPPER GASTROINTESTINAL ENDOSCOPY     Social History   Socioeconomic History  . Marital status: Married    Spouse name: Not on file  . Number of children: Not on file  . Years of education: Not on file  . Highest education level: Not on file  Occupational History  .  Not on file  Social Needs  . Financial resource strain: Not on file  . Food insecurity:    Worry: Not on file    Inability: Not on file  . Transportation needs:    Medical: Not on file    Non-medical: Not on file  Tobacco Use  . Smoking status: Never Smoker  . Smokeless tobacco: Never Used    Substance and Sexual Activity  . Alcohol use: Yes    Alcohol/week: 0.0 standard drinks    Comment: socially  . Drug use: No  . Sexual activity: Yes    Birth control/protection: IUD  Lifestyle  . Physical activity:    Days per week: Not on file    Minutes per session: Not on file  . Stress: Not on file  Relationships  . Social connections:    Talks on phone: Not on file    Gets together: Not on file    Attends religious service: Not on file    Active member of club or organization: Not on file    Attends meetings of clubs or organizations: Not on file    Relationship status: Not on file  . Intimate partner violence:    Fear of current or ex partner: Not on file    Emotionally abused: Not on file    Physically abused: Not on file    Forced sexual activity: Not on file  Other Topics Concern  . Not on file  Social History Narrative  . Not on file   Family History  Problem Relation Age of Onset  . Hypertension Mother   . Cancer Sister 31       breast  . Breast cancer Sister   . Cancer Sister        breast cancer  . Breast cancer Sister   . Colon cancer Neg Hx   . Esophageal cancer Neg Hx   . Inflammatory bowel disease Neg Hx   . Liver disease Neg Hx   . Pancreatic cancer Neg Hx   . Stomach cancer Neg Hx   . Rectal cancer Neg Hx    I have reviewed her medical, social, and family history in detail and updated the electronic medical record as necessary.    PHYSICAL EXAMINATION  BP 122/80   Pulse (!) 58   Ht 5\' 5"  (1.651 m)   Wt 188 lb 9.6 oz (85.5 kg)   SpO2 99%   BMI 31.38 kg/m  Wt Readings from Last 3 Encounters:  07/21/18 188 lb 9.6 oz (85.5 kg)  04/14/18 183 lb (83 kg)  12/27/16 175 lb (79.4 kg)  GEN: NAD, appears stated age, doesn't appear chronically ill PSYCH: Cooperative, without pressured speech EYE: Conjunctivae pink, sclerae anicteric ENT: MMM, without oral ulcers, no erythema or exudates noted NECK: Supple, without thyromegaly CV: RR without  R/Gs  RESP: CTAB posteriorly, without wheezing GI: NABS, soft, NT/ND, without rebound or guarding, no HSM appreciated MSK/EXT: No lower extremity edema SKIN: No concerning rashes NEURO:  Alert & Oriented x 3, no focal deficits   REVIEW OF DATA  I reviewed the following data at the time of this encounter:  GI Procedures and Studies  Prior upper endoscopy for which she will try to get as name and see if we can get report from [place]  Laboratory Studies  Reviewed in EPIC without evidence of significant anemia  Imaging Studies  No relevant studies   ASSESSMENT  Ms. Erica David is a 47 y.o. female with a pmh significant  for Esophageal Food Impaction s/p EGD with Dilation.  The patient is seen today for evaluation and management of:  1. Esophageal dysphagia   2. History of esophagogastroduodenoscopy (EGD)   3. Dilation of esophagus    This patient has a history of prior acute food bolus impaction with reported esophageal dilation.  We do not have access to these records however.  Her clinical history is suggestive of what seemed to be recurrent food impactions.  The differential diagnosis for her includes esophageal strictures/webs including the possibility of eosinophilic esophagitis as well as acid related changes.  As her clinical history is interesting because she does not have significant pyrosis or indigestion symptoms is not clear to me that this is all acid related changes.  With that being said we did discuss the role of a upper endoscopy as well as a possible barium swallow.  Due to the recurrences of her episodes I do think an upper endoscopy is the most reasonable approach to rule out structural etiologies as well as get biopsies.  The risks and benefits of endoscopic evaluation were discussed with the patient; these include but are not limited to the risk of perforation, infection, bleeding, missed lesions, lack of diagnosis, severe illness requiring hospitalization, as well as  anesthesia and sedation related illnesses.  The patient is agreeable to proceed.  We will hold on starting PPI therapy in the setting of trying not to mask EOE and because of the patient's current symptoms.  We also briefly discussed esophageal dysmotility which can cause patient's dysphasia as well as well as the work-up including manometry that may be required should structural etiologies not be found.  All questions as best of our ability plan for follow-up after her endoscopy has been completed.   PLAN  1. Esophageal dysphagia - Plan for upper endoscopy with esophageal biopsies to rule out EOE as well as possible dilation - If structural evaluation is negative for etiology of dysphasia then will consider manometry - We will hold on PPI therapy at this point but consider in the future  2. History of esophagogastroduodenoscopy (EGD)  3. Dilation of esophagus   Orders Placed This Encounter  Procedures  . Ambulatory referral to Gastroenterology    New Prescriptions   No medications on file   Modified Medications   No medications on file    Planned Follow Up: No follow-ups on file.   Justice Britain, MD [facility] Office # [phone]

## 2018-07-22 ENCOUNTER — Encounter: Payer: Self-pay | Admitting: Gastroenterology

## 2018-07-22 DIAGNOSIS — R1310 Dysphagia, unspecified: Secondary | ICD-10-CM | POA: Insufficient documentation

## 2018-07-22 DIAGNOSIS — R1319 Other dysphagia: Secondary | ICD-10-CM | POA: Insufficient documentation

## 2018-07-22 DIAGNOSIS — Z9889 Other specified postprocedural states: Secondary | ICD-10-CM | POA: Insufficient documentation

## 2018-07-22 DIAGNOSIS — K2289 Other specified disease of esophagus: Secondary | ICD-10-CM | POA: Insufficient documentation

## 2018-07-22 DIAGNOSIS — K228 Other specified diseases of esophagus: Secondary | ICD-10-CM | POA: Insufficient documentation

## 2018-08-01 ENCOUNTER — Encounter: Payer: Self-pay | Admitting: Gastroenterology

## 2018-08-01 ENCOUNTER — Ambulatory Visit (AMBULATORY_SURGERY_CENTER): Payer: BLUE CROSS/BLUE SHIELD | Admitting: Gastroenterology

## 2018-08-01 VITALS — BP 101/77 | HR 52 | Temp 98.6°F | Resp 8 | Ht 65.0 in | Wt 188.0 lb

## 2018-08-01 DIAGNOSIS — K222 Esophageal obstruction: Secondary | ICD-10-CM

## 2018-08-01 DIAGNOSIS — R1310 Dysphagia, unspecified: Secondary | ICD-10-CM | POA: Diagnosis not present

## 2018-08-01 DIAGNOSIS — K209 Esophagitis, unspecified: Secondary | ICD-10-CM | POA: Diagnosis not present

## 2018-08-01 MED ORDER — SODIUM CHLORIDE 0.9 % IV SOLN: 500.0000 mL | Freq: Once | INTRAVENOUS | Status: DC

## 2018-08-01 MED ORDER — OMEPRAZOLE 40 MG PO CPDR: 40.0000 mg | DELAYED_RELEASE_CAPSULE | Freq: Two times a day (BID) | ORAL | 3 refills | Status: DC

## 2018-08-01 NOTE — Patient Instructions (Addendum)
YOU HAD AN ENDOSCOPIC PROCEDURE TODAY AT [facility]:   Refer to the procedure report that was given to you for any specific questions about what was found during the examination.  If the procedure report does not answer your questions, please call your gastroenterologist to clarify.  If you requested that your care partner not be given the details of your procedure findings, then the procedure report has been included in a sealed envelope for you to review at your convenience later.  YOU SHOULD EXPECT: Some feelings of bloating in the abdomen. Passage of more gas than usual.  Walking can help get rid of the air that was put into your GI tract during the procedure and reduce the bloating. If you had a lower endoscopy (such as a colonoscopy or flexible sigmoidoscopy) you may notice spotting of blood in your stool or on the toilet paper. If you underwent a bowel prep for your procedure, you may not have a normal bowel movement for a few days.  Please Note:  You might notice some irritation and congestion in your nose or some drainage.  This is from the oxygen used during your procedure.  There is no need for concern and it should clear up in a day or so.  SYMPTOMS TO REPORT IMMEDIATELY:    Following upper endoscopy (EGD)  Vomiting of blood or coffee ground material  New chest pain or pain under the shoulder blades  Painful or persistently difficult swallowing  New shortness of breath  Fever of 100F or higher  Black, tarry-looking stools  For urgent or emergent issues, a gastroenterologist can be reached at any hour by calling [phone].   DIET:  We do recommend a small meal at first, but then you may proceed to your regular diet.  Drink plenty of fluids but you should avoid alcoholic beverages for 24 hours.  ACTIVITY:  You should plan to take it easy for the rest of today and you should NOT DRIVE or use heavy machinery until tomorrow (because of the sedation medicines used  during the test).    FOLLOW UP: Our staff will call the number listed on your records the next business day following your procedure to check on you and address any questions or concerns that you may have regarding the information given to you following your procedure. If we do not reach you, we will leave a message.  However, if you are feeling well and you are not experiencing any problems, there is no need to return our call.  We will assume that you have returned to your regular daily activities without incident.  If any biopsies were taken you will be contacted by phone or by letter within the next 1-3 weeks.  Please call us at [phone] if you have not heard about the biopsies in 3 weeks.    SIGNATURES/CONFIDENTIALITY: You and/or your care partner have signed paperwork which will be entered into your electronic medical record.  These signatures attest to the fact that that the information above on your After Visit Summary has been reviewed and is understood.  Full responsibility of the confidentiality of this discharge information lies with you and/or your care-partner.   Handouts were given to your care partner on GERD and a hiatal hernia. Prescription was sent for OMEPRAZOLE 40 mg 2 x per day.  Best to take this on an empty stomach 20-30 minutes before food. You may resume your current medications today. Await biopsy results. Will plan  follow up in 4-6 weeks to see how you are doing and determine need for manometry evaluation to rule out dysmotility. Please call if any questions or concerns.

## 2018-08-01 NOTE — Op Note (Signed)
[facility] Patient Name: Erica David Procedure Date: 08/01/2018 11:01 AM MRN: [id number] Endoscopist: Justice Britain , MD Age: 47 Referring MD:  Date of Birth: 1971-10-07 Gender: Female Account #: [id number] Procedure:                Upper GI endoscopy Indications:              Esophageal dysphagia, Suspected gastro-esophageal                            reflux disease Medicines:                Monitored Anesthesia Care Procedure:                Pre-Anesthesia Assessment:                           - Prior to the procedure, a History and Physical                            was performed, and patient medications and                            allergies were reviewed. The patient's tolerance of                            previous anesthesia was also reviewed. The risks                            and benefits of the procedure and the sedation                            options and risks were discussed with the patient.                            All questions were answered, and informed consent                            was obtained. Prior Anticoagulants: The patient has                            taken no previous anticoagulant or antiplatelet                            agents. ASA Grade Assessment: I - A normal, healthy                            patient. After reviewing the risks and benefits,                            the patient was deemed in satisfactory condition to                            undergo the procedure.  After obtaining informed consent, the endoscope was                            passed under direct vision. Throughout the                            procedure, the patient's blood pressure, pulse, and                            oxygen saturations were monitored continuously. The                            Endoscope was introduced through the mouth, and                            advanced to the third part of duodenum. The upper                             GI endoscopy was accomplished without difficulty.                            The patient tolerated the procedure well. Scope In: Scope Out: Findings:                 Normal mucosa was found in the entire esophagus.                            Biopsies were taken with a cold forceps for                            histology. Biopsies were taken with a cold forceps                            for histology.                           A possible muscular widely patent and                            non-obstructing Schatzki ring was found at the                            gastroesophageal junction. This was biopsied with a                            cold forceps to interrupt it.                           Esophagogastric landmarks were identified: the                            Z-line was found at 39 cm and the site of hiatal                              narrowing was found at 40.5 cm from the incisors.                           A 1 cm hiatal hernia was present.                           No gross lesions were noted in the entire examined                            stomach otherwise.                           No gross lesions were noted in the duodenal bulb,                            in the first portion of the duodenum, in the second                            portion of the duodenum and in the third portion of                            the duodenum. Complications:            No immediate complications. Estimated Blood Loss:     Estimated blood loss was minimal. Impression:               - Normal mucosa was found in the entire esophagus.                            Biopsied.                           - A possible, musclar, widely patent and                            non-obstructing Schatzki ring was noted. Biopsied                            to interrupt.                           - Esophagogastric landmarks identified.                           - 1 cm hiatal  hernia.                           - No gross lesions in the stomach.                           - No gross lesions in the duodenal bulb, in the                            first portion of the duodenum, in the second                              portion of the duodenum and in the third portion of                            the duodenum. Recommendation:           - The patient will be observed post-procedure,                            until all discharge criteria are met.                           - Discharge patient to home.                           - Patient has a contact number available for                            emergencies. The signs and symptoms of potential                            delayed complications were discussed with the                            patient. Return to normal activities tomorrow.                            Written discharge instructions were provided to the                            patient.                           - Resume previous diet.                           - Continue present medications.                           - Await pathology results.                           - Would begin 40 mg twice daily PPI for next                            6-weeks (Rx to be sent to patient's pharmacy today).                           - Will plan follow up in 4-6 weeks to see how                            patient is doing and determine need for manometry                            evaluation to rule out dysmotility.                           -   The findings and recommendations were discussed                            with the patient.                           - The findings and recommendations were discussed                            with the patient's family. Gabriel Mansouraty, MD 08/01/2018 11:26:47 AM 

## 2018-08-01 NOTE — Progress Notes (Signed)
Called to room to assist during endoscopic procedure.  Patient ID and intended procedure confirmed with present staff. Received instructions for my participation in the procedure from the performing physician.  

## 2018-08-01 NOTE — Progress Notes (Signed)
Per Dr. Rush Landmark pt to take omeprazole 40 mg bid 30 mins before breakfast and 30 minutes before dinner.  No problems noted in the recovery room. Dr. Rush Landmark sent rx to pt's pharmacy. maw

## 2018-08-01 NOTE — Progress Notes (Signed)
Report given to PACU, vss 

## 2018-08-02 ENCOUNTER — Telehealth: Payer: Self-pay | Admitting: *Deleted

## 2018-08-02 NOTE — Telephone Encounter (Signed)
  Follow up Call-  Call back number 08/01/2018  Post procedure Call Back phone  # [phone]  Permission to leave phone message Yes  Some recent data might be hidden     Patient questions:  Do you have a fever, pain , or abdominal swelling? No. Pain Score  0 *  Have you tolerated food without any problems? Yes.    Have you been able to return to your normal activities? Yes.    Do you have any questions about your discharge instructions: Diet   No. Medications  No. Follow up visit  No.  Do you have questions or concerns about your Care? No.  Actions: * If pain score is 4 or above: No action needed, pain <4.

## 2018-08-04 ENCOUNTER — Encounter: Payer: Self-pay | Admitting: Gastroenterology

## 2018-09-01 ENCOUNTER — Ambulatory Visit: Payer: BLUE CROSS/BLUE SHIELD | Admitting: Gastroenterology

## 2018-10-03 ENCOUNTER — Ambulatory Visit: Payer: BLUE CROSS/BLUE SHIELD | Admitting: Gastroenterology

## 2018-10-03 ENCOUNTER — Encounter: Payer: Self-pay | Admitting: Gastroenterology

## 2018-10-03 VITALS — BP 118/76 | HR 61 | Ht 65.0 in | Wt 185.4 lb

## 2018-10-03 DIAGNOSIS — K222 Esophageal obstruction: Secondary | ICD-10-CM | POA: Diagnosis not present

## 2018-10-03 DIAGNOSIS — R1310 Dysphagia, unspecified: Secondary | ICD-10-CM | POA: Diagnosis not present

## 2018-10-03 DIAGNOSIS — R1319 Other dysphagia: Secondary | ICD-10-CM

## 2018-10-03 DIAGNOSIS — K219 Gastro-esophageal reflux disease without esophagitis: Secondary | ICD-10-CM

## 2018-10-03 DIAGNOSIS — Z1211 Encounter for screening for malignant neoplasm of colon: Secondary | ICD-10-CM | POA: Diagnosis not present

## 2018-10-03 MED ORDER — OMEPRAZOLE 40 MG PO CPDR: 40.0000 mg | DELAYED_RELEASE_CAPSULE | Freq: Every day | ORAL | 3 refills | Status: DC

## 2018-10-03 NOTE — Progress Notes (Signed)
[place] VISIT   Primary Care Provider Girtha Rm, NP-C [place] [address] [phone]  Referring Provider Girtha Rm, NP-C [place], [place] [address] [phone]  Patient Profile: Erica David is a 47 y.o. female with a pmh significant for Esophageal Food Impaction s/p EGD with Dilation.  The patient presents to the [facility] for an evaluation and management of problem(s) noted below:  Problem List 1. Gastroesophageal reflux disease, esophagitis presence not specified   2. Esophageal dysphagia   3. Schatzki's ring   4. Colon cancer screening     History of Present Illness: Please see initial consultation note for full details of HPI.  This is a patient who describes issues of recurrent acute food impactions that in the past required urgent endoscopy.  She described having an esophageal dilation in [place] years ago.  She ended up having had persistent symptoms that were worsening over the course of last year or 2 and subsequently we decided to proceed with an upper endoscopy.  The results of that are below.  She had a nonobstructing Schatzki ring that was disrupted and biopsies for EOE that returned negative.  The biopsies from the Schatzki ring at the GE junction showed evidence of esophagitis with squamous inflammation but no Barrett's.  We increased her PPI to twice daily to continue for a total of 6 to 8 weeks.  She has never had a colonoscopy and will be due for colon cancer screening at the age of 5.  Interval History Today, the patient returns in scheduled follow-up.  She has done exceedingly well since her endoscopy.  She describes no recurrent episodes of significant dysphagia.  She was even able to tolerate a steak meal recently.  She is currently taking her PPI once daily at least twice daily if she can remember (difficult based on all the activities she has to do  with her family to remember).  With once daily dosing of her medication she is feeling much better however.  At this point in time the patient has not had any significant weight loss in theory she has had stable weight and she is currently doing burn [facility] to try and help maintain and hopefully encourage weight loss.  She is hopeful that she will need to be on medications lifelong but at this point in time is willing to maintain herself on medicines as we see necessary.  The patient denies any nausea or vomiting.  She has no odynophagia.   GI Review of Systems Positive as above Negative for globus, early satiety, abdominal pain, melena, hematochezia  Review of Systems General: Denies fevers/chills Cardiovascular: Denies any chest pain or chest discomfort Pulmonary: Denies shortness of breath Gastroenterological: See HPI Genitourinary: Denies darkened urine Dermatological: Denies jaundice Psychological: Mood is stable   Medications Current Outpatient Medications  Medication Sig Dispense Refill  . levonorgestrel (MIRENA) 20 MCG/24HR IUD by Intrauterine route.    Marland Kitchen omeprazole (PRILOSEC) 40 MG capsule Take 1 capsule (40 mg total) by mouth 2 (two) times daily. (Patient taking differently: Take 40 mg by mouth daily. ) 60 capsule 3  . omeprazole (PRILOSEC) 40 MG capsule Take 1 capsule (40 mg total) by mouth daily. 90 capsule 3   Current Facility-Administered Medications  Medication Dose Route Frequency Provider Last Rate Last Dose  . 0.9 %  sodium chloride infusion  500 mL Intravenous Once Mansouraty, Telford Nab., MD        Allergies No  Known Allergies  Histories Past Medical History:  Diagnosis Date  . Epicondylitis elbow, medial   . Esophageal stricture    Past Surgical History:  Procedure Laterality Date  .  endoscopy with dilation    . UPPER GASTROINTESTINAL ENDOSCOPY     Social History   Socioeconomic History  . Marital status: Married    Spouse name: Not on file  .  Number of children: Not on file  . Years of education: Not on file  . Highest education level: Not on file  Occupational History  . Not on file  Social Needs  . Financial resource strain: Not on file  . Food insecurity:    Worry: Not on file    Inability: Not on file  . Transportation needs:    Medical: Not on file    Non-medical: Not on file  Tobacco Use  . Smoking status: Never Smoker  . Smokeless tobacco: Never Used  Substance and Sexual Activity  . Alcohol use: Yes    Alcohol/week: 0.0 standard drinks    Comment: socially  . Drug use: No  . Sexual activity: Yes    Birth control/protection: IUD  Lifestyle  . Physical activity:    Days per week: Not on file    Minutes per session: Not on file  . Stress: Not on file  Relationships  . Social connections:    Talks on phone: Not on file    Gets together: Not on file    Attends religious service: Not on file    Active member of club or organization: Not on file    Attends meetings of clubs or organizations: Not on file    Relationship status: Not on file  . Intimate partner violence:    Fear of current or ex partner: Not on file    Emotionally abused: Not on file    Physically abused: Not on file    Forced sexual activity: Not on file  Other Topics Concern  . Not on file  Social History Narrative  . Not on file   Family History  Problem Relation Age of Onset  . Hypertension Mother   . Cancer Sister 51       breast  . Breast cancer Sister   . Cancer Sister        breast cancer  . Breast cancer Sister   . Colon cancer Neg Hx   . Esophageal cancer Neg Hx   . Inflammatory bowel disease Neg Hx   . Liver disease Neg Hx   . Pancreatic cancer Neg Hx   . Stomach cancer Neg Hx   . Rectal cancer Neg Hx    I have reviewed her medical, social, and family history in detail and updated the electronic medical record as necessary.    PHYSICAL EXAMINATION  BP 118/76   Pulse 61   Ht 5\' 5"  (1.651 m)   Wt 185 lb 6.4 oz  (84.1 kg)   SpO2 98%   BMI 30.85 kg/m  Wt Readings from Last 3 Encounters:  10/03/18 185 lb 6.4 oz (84.1 kg)  08/01/18 188 lb (85.3 kg)  07/21/18 188 lb 9.6 oz (85.5 kg)  GEN: NAD, appears stated age, doesn't appear chronically ill PSYCH: Cooperative, without pressured speech EYE: Conjunctivae pink, sclerae anicteric ENT: MMM CV: RR without R/Gs  RESP: CTAB posteriorly, without wheezing GI: NABS, soft, NT/ND, without rebound or guarding, no HSM appreciated MSK/EXT: No lower extremity edema SKIN: No jaundice NEURO:  Alert & Oriented x  3, no focal deficits   REVIEW OF DATA  I reviewed the following data at the time of this encounter:  GI Procedures and Studies  9/19 EGD - Normal mucosa was found in the entire esophagus. Biopsied. - A possible, musclar, widely patent and non-obstructing Schatzki ring was noted. Biopsied to interrupt. - Esophagogastric landmarks identified. - 1 cm hiatal hernia. - No gross lesions in the stomach. - No gross lesions in the duodenal bulb, in the first portion of the duodenum, in the second portion of the duodenum and in the third portion of the duodenum. Negative biopsies for EoE but did show at [place] inflamed squamous mucosa but no Barrett's  Laboratory Studies  Reviewed in EPIC  Imaging Studies  No relevant studies   ASSESSMENT  Ms. Hartwell is a 47 y.o. female with a pmh significant for Esophageal Food Impaction s/p EGD with Dilation.  The patient is seen today for evaluation and management of:  1. Gastroesophageal reflux disease, esophagitis presence not specified   2. Esophageal dysphagia   3. Schatzki's ring   4. Colon cancer screening    The patient has done well since her endoscopy in September.  She describes no significant recurrence of acute food bolus impactions.  We have maintained her on acid suppression medication.  Ideally she would be taking twice daily PPI however as she is doing better on just once daily I am okay with  maintaining this at the current dose.  If she has recurrent symptoms she will let us know however I would have her increase to definitely taking twice daily PPI 40 mg.  She needs a refill of her medications currently.  We will plan to maintain her on her current dose of medications for the next few months.  If she has recurrence of her symptoms I would plan to repeat an EGD and re-disrupt and potentially dilate the Schatzki ring.  We are holding on manometric studies at this point in time.  The patient is okay with this plan.  Patient will also be due for colon cancer screening in September of next year and we will place a recall for that time.  All patient questions were answered, to the best of my ability, and the patient agrees to the aforementioned plan of action with follow-up as indicated.   PLAN  1. Esophageal dysphagia - If recurrent symptoms will truly increase to 40 mg BID PPI - If recurrent symptoms then will proceed with repeat EGD with repeat disruption of Schatzki if still present via biopsy or more likely via dilation - Hold on Manometry for now  2. Schatzki's ring - As per above  3. Gastroesophageal reflux disease, esophagitis presence not specified - Continue PPI 40 mg QD  4. Colon cancer screening - Recall for 07/2019 for colon cancer screening #1   No orders of the defined types were placed in this encounter.   New Prescriptions   OMEPRAZOLE (PRILOSEC) 40 MG CAPSULE    Take 1 capsule (40 mg total) by mouth daily.   Modified Medications   No medications on file    Planned Follow Up: No follow-ups on file.   Justice Britain, MD [facility] Office # [phone]

## 2018-10-03 NOTE — Patient Instructions (Signed)
Normal BMI (Body Mass Index- based on height and weight) is between 19 and 25. Your BMI today is Body mass index is 30.85 kg/m. Marland Kitchen Please consider follow up  regarding your BMI with your Primary Care Provider.  We have sent the following medications to your pharmacy for you to pick up at your convenience: Omeprazole  You will be contacted for screening colonoscopy next September  Thank you for entrusting me with your care and choosing [facility] care.  Dr Rush Landmark

## 2018-10-05 DIAGNOSIS — Z0000 Encounter for general adult medical examination without abnormal findings: Secondary | ICD-10-CM | POA: Insufficient documentation

## 2018-10-05 DIAGNOSIS — K222 Esophageal obstruction: Secondary | ICD-10-CM | POA: Insufficient documentation

## 2018-10-05 DIAGNOSIS — K219 Gastro-esophageal reflux disease without esophagitis: Secondary | ICD-10-CM | POA: Insufficient documentation

## 2018-10-05 DIAGNOSIS — Z1211 Encounter for screening for malignant neoplasm of colon: Secondary | ICD-10-CM | POA: Insufficient documentation

## 2018-10-27 DIAGNOSIS — D23121 Other benign neoplasm of skin of left upper eyelid, including canthus: Secondary | ICD-10-CM | POA: Diagnosis not present

## 2018-10-27 DIAGNOSIS — H5203 Hypermetropia, bilateral: Secondary | ICD-10-CM | POA: Diagnosis not present

## 2019-04-15 NOTE — Progress Notes (Signed)
Subjective:    Patient ID: Erica David, female    DOB: 12-26-1970, 48 y.o.   MRN: [id number]  HPI Chief Complaint  Patient presents with  . fasting cpe    fasting cpe, gets eye checked yearly. wants obgyn numbers to make an appt   She is here for a complete physical exam. Reports being in her usual state of health and has no new concerns.  Other providers: GI- Dr. Gregor Hams- Dr. [facility]    She is due for pap smear and due to have her IUD removed as well. Will give her a list of OB/GYNs.    Social history: Lives with her husband and 3 children, works as an Optometrist  Denies smoking, drinking alcohol, drug use  Diet: fairly healthy  Excerise: regular routine   Immunizations: Up-to-date  Health maintenance:  Mammogram: 04/2018 Colonoscopy: never  Last Gynecological Exam: pap smear 12/2015 negative HPV Last Menstrual cycle: IUD Last Dental Exam: due next week  Last Eye Exam: December 2019   Wears seatbelt always, uses sunscreen, smoke detectors in home and functioning, does not text while driving and feels safe in home environment.   Reviewed allergies, medications, past medical, surgical, family, and social history.   Review of Systems Review of Systems Constitutional: -fever, -chills, -sweats, -unexpected weight change,-fatigue ENT: -runny nose, -ear pain, -sore throat Cardiology:  -chest pain, -palpitations, -edema Respiratory: -cough, -shortness of breath, -wheezing Gastroenterology: -abdominal pain, -nausea, -vomiting, -diarrhea, -constipation  Hematology: -bleeding or bruising problems Musculoskeletal: -arthralgias, -myalgias, -joint swelling, -back pain Ophthalmology: -vision changes Urology: -dysuria, -difficulty urinating, -hematuria, -urinary frequency, -urgency Neurology: -headache, -weakness, -tingling, -numbness       Objective:   Physical Exam BP 122/80   Pulse (!) 54   Temp 97.7 F (36.5 C) (Oral)    Ht 5\' 4"  (1.626 m)   Wt 183 lb 3.2 oz (83.1 kg)   SpO2 98%   BMI 31.45 kg/m   General Appearance:    Alert, cooperative, no distress, appears stated age  Head:    Normocephalic, without obvious abnormality, atraumatic  Eyes:    PERRL, conjunctiva/corneas clear, EOM's intact, fundi    benign  Ears:    Normal TM's and external ear canals  Nose:   Mask in place due to Covid-19 pandemic   Throat:   Mask in place due to Covid-19 pandemic   Neck:   Supple, no lymphadenopathy;  thyroid:  no   enlargement/tenderness/nodules; no carotid   bruit or JVD  Back:    Spine nontender, no curvature, ROM normal, no CVA     tenderness  Lungs:     Clear to auscultation bilaterally without wheezes, rales or     ronchi; respirations unlabored  Chest Wall:    No tenderness or deformity   Heart:    Regular rate and rhythm, S1 and S2 normal, no murmur, rub   or gallop  Breast Exam:    OB/GYN  Abdomen:     Soft, non-tender, nondistended, normoactive bowel sounds,    no masses, no hepatosplenomegaly  Genitalia:    OB/GYN  Rectal:    Not performed due to age<40 and no related complaints  Extremities:   No clubbing, cyanosis or edema  Pulses:   2+ and symmetric all extremities  Skin:   Skin color, texture, turgor normal, no rashes or lesions  Lymph nodes:   Cervical, supraclavicular, and axillary nodes normal  Neurologic:   CNII-XII intact, normal strength, sensation and  gait; reflexes 2+ and symmetric throughout          Psych:   Normal mood, affect, hygiene and grooming.         Assessment & Plan:  Routine general medical examination at a health care facility - Plan: CBC with Differential/Platelet, Comprehensive metabolic panel, TSH, T4, free, Lipid panel  She appears to be doing quite well physically and emotionally. No new concerns or complaints today. Encouraged her to continue taking good care of herself by eating a healthy diet and getting at least 150 minutes of physical activity per week.  She  plays tennis several hours each week. Plans to call and schedule with her gynecologist since she is due for IUD removal. She will call and schedule her mammogram. She has a GI and will call to schedule her colonoscopy when it is time. Immunizations up-to-date.  We did discuss Shingrix after she turns 48. Follow-up pending labs

## 2019-04-16 ENCOUNTER — Ambulatory Visit: Payer: BLUE CROSS/BLUE SHIELD | Admitting: Family Medicine

## 2019-04-16 ENCOUNTER — Other Ambulatory Visit: Payer: Self-pay

## 2019-04-16 ENCOUNTER — Encounter: Payer: Self-pay | Admitting: Family Medicine

## 2019-04-16 VITALS — BP 122/80 | HR 54 | Temp 97.7°F | Ht 64.0 in | Wt 183.2 lb

## 2019-04-16 DIAGNOSIS — Z0000 Encounter for general adult medical examination without abnormal findings: Secondary | ICD-10-CM

## 2019-04-16 DIAGNOSIS — R7309 Other abnormal glucose: Secondary | ICD-10-CM | POA: Diagnosis not present

## 2019-04-16 NOTE — Patient Instructions (Signed)
It is time to schedule your mammogram. You can call to do this at the [facility].   You may call to schedule with a gynecologist.   Dr. Princess Bruins is located at [facility]. [place] [phone]  Other practices you may call.   Obgyn Offices:   [facility] [address] [place], [place] [phone]  [facility] Address: [address] [place], [place] [address] Phone: [phone]   Preventive Care 40-64 Years, Female Preventive care refers to lifestyle choices and visits with your health care provider that can promote health and wellness. What does preventive care include?   A yearly physical exam. This is also called an annual well check.  Dental exams once or twice a year.  Routine eye exams. Ask your health care provider how often you should have your eyes checked.  Personal lifestyle choices, including: ? Daily care of your teeth and gums. ? Regular physical activity. ? Eating a healthy diet. ? Avoiding tobacco and drug use. ? Limiting alcohol use. ? Practicing safe sex. ? Taking low-dose aspirin daily starting at age 23. ? Taking vitamin and mineral supplements as recommended by your health care provider. What happens during an annual well check? The services and screenings done by your health care provider during your annual well check will depend on your age, overall health, lifestyle risk factors, and family history of disease. Counseling Your health care provider may ask you questions about your:  Alcohol use.  Tobacco use.  Drug use.  Emotional well-being.  Home and relationship well-being.  Sexual activity.  Eating habits.  Work and work Statistician.  Method of birth control.  Menstrual cycle.  Pregnancy history. Screening You may have the following tests or measurements:  Height, weight, and BMI.   Blood pressure.  Lipid and cholesterol levels. These may be checked every 5 years, or more frequently if you are over 58 years old.  Skin check.  Lung cancer screening. You may have this screening every year starting at age 39 if you have a 30-pack-year history of smoking and currently smoke or have quit within the past 15 years.  Colorectal cancer screening. All adults should have this screening starting at age 101 and continuing until age 66. Your health care provider may recommend screening at age 39. You will have tests every 1-10 years, depending on your results and the type of screening test. People at increased risk should start screening at an earlier age. Screening tests may include: ? Guaiac-based fecal occult blood testing. ? Fecal immunochemical test (FIT). ? Stool DNA test. ? Virtual colonoscopy. ? Sigmoidoscopy. During this test, a flexible tube with a tiny camera (sigmoidoscope) is used to examine your rectum and lower colon. The sigmoidoscope is inserted through your anus into your rectum and lower colon. ? Colonoscopy. During this test, a long, thin, flexible tube with a tiny camera (colonoscope) is used to examine your entire colon and rectum.  Hepatitis C blood test.  Hepatitis B blood test.  Sexually transmitted disease (STD) testing.  Diabetes screening. This is done by checking your blood sugar (glucose) after you have not eaten for a while (fasting). You may have this done every 1-3 years.  Mammogram. This may be done every 1-2 years. Talk to your health care provider about when you should start having regular mammograms. This may depend on whether you have a family history of breast cancer.  BRCA-related cancer screening. This may be done if you have a family history of breast, ovarian, tubal, or peritoneal cancers.  Pelvic exam and Pap test. This may be done every 3 years starting at age 25. Starting at age 70, this may be done every 5 years if you have a Pap test  in combination with an HPV test.  Bone density scan. This is done to screen for osteoporosis. You may have this scan if you are at high risk for osteoporosis. Discuss your test results, treatment options, and if necessary, the need for more tests with your health care provider. Vaccines Your health care provider may recommend certain vaccines, such as:  Influenza vaccine. This is recommended every year.  Tetanus, diphtheria, and acellular pertussis (Tdap, Td) vaccine. You may need a Td booster every 10 years.  Varicella vaccine. You may need this if you have not been vaccinated.  Zoster vaccine. You may need this after age 81.  Measles, mumps, and rubella (MMR) vaccine. You may need at least one dose of MMR if you were born in 1957 or later. You may also need a second dose.  Pneumococcal 13-valent conjugate (PCV13) vaccine. You may need this if you have certain conditions and were not previously vaccinated.  Pneumococcal polysaccharide (PPSV23) vaccine. You may need one or two doses if you smoke cigarettes or if you have certain conditions.  Meningococcal vaccine. You may need this if you have certain conditions.  Hepatitis A vaccine. You may need this if you have certain conditions or if you travel or work in places where you may be exposed to hepatitis A.  Hepatitis B vaccine. You may need this if you have certain conditions or if you travel or work in places where you may be exposed to hepatitis B.  Haemophilus influenzae type b (Hib) vaccine. You may need this if you have certain conditions. Talk to your health care provider about which screenings and vaccines you need and how often you need them. This information is not intended to replace advice given to you by your health care provider. Make sure you discuss any questions you have with your health care provider. Document Released: 11/28/2015 Document Revised: 12/22/2017 Document Reviewed: 09/02/2015 Elsevier Interactive Patient  Education  2019 [employer].

## 2019-04-17 LAB — CBC WITH DIFFERENTIAL/PLATELET
Basophils Absolute: 0 10*3/uL (ref 0.0–0.2)
Basos: 1 %
EOS (ABSOLUTE): 0.2 10*3/uL (ref 0.0–0.4)
Eos: 3 %
Hematocrit: 40.4 % (ref 34.0–46.6)
Hemoglobin: 13.5 g/dL (ref 11.1–15.9)
Immature Grans (Abs): 0 10*3/uL (ref 0.0–0.1)
Immature Granulocytes: 0 %
Lymphocytes Absolute: 1.6 10*3/uL (ref 0.7–3.1)
Lymphs: 30 %
MCH: 30.5 pg (ref 26.6–33.0)
MCHC: 33.4 g/dL (ref 31.5–35.7)
MCV: 91 fL (ref 79–97)
Monocytes Absolute: 0.5 10*3/uL (ref 0.1–0.9)
Monocytes: 10 %
Neutrophils Absolute: 3 10*3/uL (ref 1.4–7.0)
Neutrophils: 56 %
Platelets: 239 10*3/uL (ref 150–450)
RBC: 4.43 x10E6/uL (ref 3.77–5.28)
RDW: 12.5 % (ref 11.7–15.4)
WBC: 5.4 10*3/uL (ref 3.4–10.8)

## 2019-04-17 LAB — COMPREHENSIVE METABOLIC PANEL
ALT: 6 IU/L (ref 0–32)
AST: 18 IU/L (ref 0–40)
Albumin/Globulin Ratio: 1.8 (ref 1.2–2.2)
Albumin: 4.3 g/dL (ref 3.8–4.8)
Alkaline Phosphatase: 69 IU/L (ref 39–117)
BUN/Creatinine Ratio: 21 (ref 9–23)
BUN: 14 mg/dL (ref 6–24)
Bilirubin Total: 0.3 mg/dL (ref 0.0–1.2)
CO2: 25 mmol/L (ref 20–29)
Calcium: 9.2 mg/dL (ref 8.7–10.2)
Chloride: 106 mmol/L (ref 96–106)
Creatinine, Ser: 0.68 mg/dL (ref 0.57–1.00)
GFR calc Af Amer: 119 mL/min/{1.73_m2} (ref >59)
GFR calc non Af Amer: 103 mL/min/{1.73_m2} (ref >59)
Globulin, Total: 2.4 g/dL (ref 1.5–4.5)
Glucose: 101 mg/dL — ABNORMAL HIGH (ref 65–99)
Potassium: 4.6 mmol/L (ref 3.5–5.2)
Sodium: 143 mmol/L (ref 134–144)
Total Protein: 6.7 g/dL (ref 6.0–8.5)

## 2019-04-17 LAB — LIPID PANEL
Chol/HDL Ratio: 3 ratio (ref 0.0–4.4)
Cholesterol, Total: 170 mg/dL (ref 100–199)
HDL: 57 mg/dL (ref >39)
LDL Calculated: 102 mg/dL — ABNORMAL HIGH (ref 0–99)
Triglycerides: 53 mg/dL (ref 0–149)
VLDL Cholesterol Cal: 11 mg/dL (ref 5–40)

## 2019-04-17 LAB — T4, FREE: Free T4: 1.23 ng/dL (ref 0.82–1.77)

## 2019-04-17 LAB — TSH: TSH: 3.7 u[IU]/mL (ref 0.450–4.500)

## 2019-04-19 LAB — SPECIMEN STATUS REPORT

## 2019-04-19 LAB — HGB A1C W/O EAG: Hgb A1c MFr Bld: 5.4 % (ref 4.8–5.6)

## 2019-04-25 ENCOUNTER — Other Ambulatory Visit: Payer: Self-pay | Admitting: Family Medicine

## 2019-04-25 DIAGNOSIS — Z1231 Encounter for screening mammogram for malignant neoplasm of breast: Secondary | ICD-10-CM

## 2019-05-10 ENCOUNTER — Other Ambulatory Visit: Payer: Self-pay

## 2019-05-10 ENCOUNTER — Ambulatory Visit
Admission: RE | Admit: 2019-05-10 | Discharge: 2019-05-10 | Disposition: A | Discharge status: Home / Self Care | Payer: BC Managed Care – PPO | Source: Ambulatory Visit | Attending: Family Medicine | Admitting: Family Medicine

## 2019-05-10 DIAGNOSIS — Z1231 Encounter for screening mammogram for malignant neoplasm of breast: Secondary | ICD-10-CM

## 2019-07-16 ENCOUNTER — Encounter: Payer: Self-pay | Admitting: Family Medicine

## 2019-07-16 ENCOUNTER — Ambulatory Visit (INDEPENDENT_AMBULATORY_CARE_PROVIDER_SITE_OTHER): Payer: BC Managed Care – PPO | Admitting: Family Medicine

## 2019-07-16 ENCOUNTER — Ambulatory Visit: Payer: Self-pay

## 2019-07-16 DIAGNOSIS — G8929 Other chronic pain: Secondary | ICD-10-CM

## 2019-07-16 DIAGNOSIS — M25511 Pain in right shoulder: Secondary | ICD-10-CM | POA: Diagnosis not present

## 2019-07-16 DIAGNOSIS — M25561 Pain in right knee: Secondary | ICD-10-CM | POA: Diagnosis not present

## 2019-07-16 NOTE — Progress Notes (Signed)
Erica David - 48 y.o. female MRN [id number]  Date of birth: Aug 02, 1971  Office Visit Note: Visit Date: 07/16/2019 PCP: Girtha Rm, NP-C Referred by: Girtha Rm, NP-C  Subjective: Chief Complaint  Patient presents with  . Right Shoulder - Pain  . Right Knee - Pain  . Shoulder Pain    3 Weeks, no injury. Plays tennis. Shoulder feels better today, has been resting. Anterior shoulder pain, some tightness, more muscle ache.   . Knee Pain    3 years ago tennis tournament, intial pain. Medial knee pain. Does wear brace while playing tennis or gym, which helps. Last couple of weeks, increase pain some swelling. Started using some voltaren gel.    HPI: Erica David is a 48 y.o. female who comes in today with chronic left knee pain and acute right shoulder pain.  She is an avid Firefighter. 3 years ago, she was playing in a state championship tournament where she played 5 matches in 3 days. She did not have an acute injury or single event where it started hurting but after that tournament she started to have medial knee pain. She had an MRI of her knee in [place] which she does not have with her but she reports it showed an issue with her cartilage but no tears with no mention of injury to meniscus or ACL. She currently wears a brace with tennis due to feelings of knee instability, but no locking/popping/catching in knee. She does not notice significant swelling.  Right shoulder- she started to have pain on anterior part of right shoulder 3 weeks ago. She first noticed it after a tennis match where she was serving well and hitting a lot of hard serves. Since that time, she has felt pain with push ups but has been able to play tennis with hitting lighter second serves. She tried voltaren gel and pain has improved significantly today.  ROS Otherwise per HPI.  Assessment & Plan: Visit Diagnoses:  1. Acute pain of right shoulder   2. Chronic pain of right knee    Shoulder- suspect pectoralis minor strain given location of pain and mechanism of injury with serve. Healing well with improved pain. Continue to take it easy without aggravation; suspect it will continue to heal quickly.  Knee- Suspect feeling of instability is due to knee effusion with mild OA seen on knee x-rays. She could also possibly have meniscal pathology with positive Thessaly test but no history of clicking/popping/catching. Continue quad strengthening exercises and knee brace for stability with tennis.  Plan:  - continue Voltaren gel and quad strengthening exercises - start glucosamine for joint pain relief - if pain persists, return in corticosteroid injection  Meds & Orders: No orders of the defined types were placed in this encounter.  No orders of the defined types were placed in this encounter.   Follow-up: PRN  Procedures: No procedures performed  No notes on file   Clinical History: No specialty comments available.   She reports that she has never smoked. She has never used smokeless tobacco.  Recent Labs    04/16/19 0918  HGBA1C 5.4    Objective:  VS:  HT:    WT:   BMI:     BP:   HR: bpm  TEMP: ( )  RESP:  Physical Exam  PHYSICAL EXAM: Gen: NAD, alert, cooperative with exam, well-appearing HEENT: clear conjunctiva,  CV:  no edema, capillary refill brisk, normal rate Resp: non-labored Skin: no rashes,  normal turgor  Neuro: no gross deficits.  Psych:  alert and oriented  Ortho Exam   Right Knee: - Inspection: 1+ effusion, no erythema or bruising. Skin intact - Palpation: TTP along medial joint line - ROM: full active ROM with flexion and extension in knee and hip - Strength: 5/5 strength - Neuro/vasc: NV intact - Special Tests: - LIGAMENTS: negative anterior and posterior drawer, negative Lachman's, no MCL or LCL laxity  -- MENISCUS: negative McMurray's, positive Thessaly  -- PF JOINT: nml patellar mobility bilaterally.  negative patellar  grind,   Right Shoulder: Inspection reveals no obvious deformity, atrophy, or asymmetry. No bruising. No swelling Mild TTP over [facility] joint and coracoid process, no TTP over bicipital groove. Full ROM in flexion, abduction, internal/external rotation NV intact distally Normal scapular function observed. Special Tests:  - Impingement: Neg Hawkins, neers, empty can sign. - Supraspinatous: Negative empty can.  5/5 strength with resisted flexion at 20 degrees - Infraspinatous/Teres Minor: 5/5 strength with ER - Subscapularis: negative belly press, negative bear hug. 5/5 strength with IR - Biceps tendon: Negative Speeds, Yerrgason's  - Labrum: Negative Obriens, negative clunk, good stability - AC Joint: Negative cross arm   Imaging: No results found.  Past Medical/Family/Surgical/Social History: Medications & Allergies reviewed per EMR, new medications updated. Patient Active Problem List   Diagnosis Date Noted  . Schatzki's ring 10/05/2018  . Gastroesophageal reflux disease 10/05/2018  . Colon cancer screening 10/05/2018  . History of esophagogastroduodenoscopy (EGD) 07/22/2018  . Dilation of esophagus 07/22/2018  . Esophageal dysphagia 07/22/2018  . Epicondylitis elbow, medial 12/15/2015   Past Medical History:  Diagnosis Date  . Epicondylitis elbow, medial   . Esophageal stricture    Family History  Problem Relation Age of Onset  . Hypertension Mother   . Cancer Sister 49       breast  . Breast cancer Sister   . Cancer Sister        breast cancer  . Breast cancer Sister   . Colon cancer Neg Hx   . Esophageal cancer Neg Hx   . Inflammatory bowel disease Neg Hx   . Liver disease Neg Hx   . Pancreatic cancer Neg Hx   . Stomach cancer Neg Hx   . Rectal cancer Neg Hx    Past Surgical History:  Procedure Laterality Date  .  endoscopy with dilation    . UPPER GASTROINTESTINAL ENDOSCOPY     Social History   Occupational History  . Not on file  Tobacco Use  .  Smoking status: Never Smoker  . Smokeless tobacco: Never Used  Substance and Sexual Activity  . Alcohol use: Yes    Alcohol/week: 0.0 standard drinks    Comment: socially  . Drug use: No  . Sexual activity: Yes    Birth control/protection: I.U.D.

## 2019-07-16 NOTE — Progress Notes (Signed)
I saw and examined the patient with Dr. Mayer Masker and agree with assessment and plan as outlined.    Right shoulder pain, possible pectoralis minor strain.  Improving.  Chronic right knee pain, possibly due to early DJD vs. Degenerative MMT.  Brace during tennis; glucosamine; voltaren gel prn.  Injection if worsens, or possibly new MRI.

## 2019-08-16 ENCOUNTER — Encounter: Payer: Self-pay | Admitting: Gastroenterology

## 2019-09-07 DIAGNOSIS — N915 Oligomenorrhea, unspecified: Secondary | ICD-10-CM | POA: Diagnosis not present

## 2019-09-07 DIAGNOSIS — Z1151 Encounter for screening for human papillomavirus (HPV): Secondary | ICD-10-CM | POA: Diagnosis not present

## 2019-09-07 DIAGNOSIS — Z01419 Encounter for gynecological examination (general) (routine) without abnormal findings: Secondary | ICD-10-CM | POA: Diagnosis not present

## 2019-09-07 DIAGNOSIS — Z6832 Body mass index (BMI) 32.0-32.9, adult: Secondary | ICD-10-CM | POA: Diagnosis not present

## 2019-09-07 DIAGNOSIS — Z124 Encounter for screening for malignant neoplasm of cervix: Secondary | ICD-10-CM | POA: Diagnosis not present

## 2019-09-07 DIAGNOSIS — Z1389 Encounter for screening for other disorder: Secondary | ICD-10-CM | POA: Diagnosis not present

## 2019-09-07 LAB — HM PAP SMEAR: HM Pap smear: NEGATIVE

## 2019-09-19 ENCOUNTER — Other Ambulatory Visit: Payer: Self-pay

## 2019-09-19 DIAGNOSIS — Z20822 Contact with and (suspected) exposure to covid-19: Secondary | ICD-10-CM

## 2019-09-21 LAB — NOVEL CORONAVIRUS, NAA: SARS-CoV-2, NAA: NOT DETECTED

## 2019-09-27 DIAGNOSIS — M25561 Pain in right knee: Secondary | ICD-10-CM | POA: Diagnosis not present

## 2019-10-03 ENCOUNTER — Other Ambulatory Visit: Payer: Self-pay

## 2019-10-03 DIAGNOSIS — Z20822 Contact with and (suspected) exposure to covid-19: Secondary | ICD-10-CM

## 2019-10-04 LAB — NOVEL CORONAVIRUS, NAA: SARS-CoV-2, NAA: NOT DETECTED

## 2019-10-08 DIAGNOSIS — M25561 Pain in right knee: Secondary | ICD-10-CM | POA: Diagnosis not present

## 2019-10-29 DIAGNOSIS — H04123 Dry eye syndrome of bilateral lacrimal glands: Secondary | ICD-10-CM | POA: Diagnosis not present

## 2019-10-29 DIAGNOSIS — H524 Presbyopia: Secondary | ICD-10-CM | POA: Diagnosis not present

## 2020-01-26 ENCOUNTER — Ambulatory Visit: Payer: Self-pay | Attending: Internal Medicine

## 2020-01-26 DIAGNOSIS — Z23 Encounter for immunization: Secondary | ICD-10-CM

## 2020-01-26 NOTE — Progress Notes (Signed)
   Covid-19 Vaccination Clinic  Name:  Erica David    MRN: [id number] DOB: December 15, 1970  01/26/2020  [name] was observed post Covid-19 immunization for 15 minutes without incident. She was provided with Vaccine Information Sheet and instruction to access the V-Safe system.   [name] was instructed to call 911 with any severe reactions post vaccine: Marland Kitchen Difficulty breathing  . Swelling of face and throat  . A fast heartbeat  . A bad rash all over body  . Dizziness and weakness   Immunizations Administered    Name Date Dose VIS Date Route   Pfizer COVID-19 Vaccine 01/26/2020  3:45 PM 0.3 mL 10/26/2019 Intramuscular   Manufacturer: [place]   Lot: [id number]   [place]: [id number]

## 2020-02-20 ENCOUNTER — Ambulatory Visit: Payer: Self-pay | Attending: Internal Medicine

## 2020-02-20 DIAGNOSIS — Z23 Encounter for immunization: Secondary | ICD-10-CM

## 2020-02-20 NOTE — Progress Notes (Signed)
   Covid-19 Vaccination Clinic  Name:  Erica David    MRN: [id number] DOB: 1971/03/15  02/20/2020  Erica David was observed post Covid-19 immunization for 15 minutes without incident. She was provided with Vaccine Information Sheet and instruction to access the V-Safe system.   Erica David was instructed to call 911 with any severe reactions post vaccine: Marland Kitchen Difficulty breathing  . Swelling of face and throat  . A fast heartbeat  . A bad rash all over body  . Dizziness and weakness   Immunizations Administered    Name Date Dose VIS Date Route   Pfizer COVID-19 Vaccine 02/20/2020 11:44 AM 0.03 mL 10/26/2019 Intramuscular   Manufacturer: [place]   Lot: [id number]   [place]: [id number]

## 2020-03-03 DIAGNOSIS — L821 Other seborrheic keratosis: Secondary | ICD-10-CM | POA: Diagnosis not present

## 2020-03-03 DIAGNOSIS — L814 Other melanin hyperpigmentation: Secondary | ICD-10-CM | POA: Diagnosis not present

## 2020-03-03 DIAGNOSIS — L578 Other skin changes due to chronic exposure to nonionizing radiation: Secondary | ICD-10-CM | POA: Diagnosis not present

## 2020-03-03 DIAGNOSIS — D225 Melanocytic nevi of trunk: Secondary | ICD-10-CM | POA: Diagnosis not present

## 2020-03-20 DIAGNOSIS — M25571 Pain in right ankle and joints of right foot: Secondary | ICD-10-CM | POA: Diagnosis not present

## 2020-03-27 DIAGNOSIS — M25571 Pain in right ankle and joints of right foot: Secondary | ICD-10-CM | POA: Diagnosis not present

## 2020-04-01 ENCOUNTER — Encounter: Payer: Self-pay | Admitting: Family Medicine

## 2020-04-01 ENCOUNTER — Other Ambulatory Visit: Payer: Self-pay

## 2020-04-01 ENCOUNTER — Ambulatory Visit (INDEPENDENT_AMBULATORY_CARE_PROVIDER_SITE_OTHER): Payer: BLUE CROSS/BLUE SHIELD | Admitting: Family Medicine

## 2020-04-01 DIAGNOSIS — G8929 Other chronic pain: Secondary | ICD-10-CM

## 2020-04-01 DIAGNOSIS — M25571 Pain in right ankle and joints of right foot: Secondary | ICD-10-CM | POA: Diagnosis not present

## 2020-04-01 DIAGNOSIS — M25561 Pain in right knee: Secondary | ICD-10-CM | POA: Diagnosis not present

## 2020-04-01 NOTE — Progress Notes (Signed)
Office Visit Note   Patient: Erica David           Date of Birth: 08/20/1971           MRN: [id number] Visit Date: 04/01/2020 Requested by: Girtha Rm, NP-C [place],  [place] [id number] PCP: Girtha Rm, NP-C  Subjective: Chief Complaint  Patient presents with  . Right Ankle - Pain    Fell while playing tennis 2 & 1/2 weeks ago. Does not hurt to walk. Did not have a lot of swelling, but did have some lateral swelling/bruising & pain. Been going to PT.  . Right Knee - Pain    Knee has gotten no better since last visit on this 07/16/19. She has a report on her phone from and MRI of the knee done in [place] 2019.    HPI: She is here with right ankle pain.  About 2 and half weeks ago she was back stepping to get a tennis ball, tripped and fell inverting her ankle.  Immediate pain, stopped playing tennis.  She has been going to physical therapy since then working with [employer].  She feels like she is improving but wants to be sure that he will be safe to play tennis in a couple weeks.  Her right knee still bothers her, but it is tolerable as long as she is wearing her brace.  She brought in the MRI report from 2 years ago on her phone.  The report shows areas of grade IV chondromalacia in the patellofemoral joint with mild to moderate changes in the medial and lateral compartments.  No meniscus pathology or ligament tear seen.               ROS:   All other systems were reviewed and are negative.  Objective: Vital Signs: There were no vitals taken for this visit.  Physical Exam:  General:  Alert and oriented, in no acute distress. Pulm:  Breathing unlabored. Psy:  Normal mood, congruent affect. Skin: No bruising Right knee: 2+ patellofemoral crepitus, no effusion today. Right ankle: No tenderness at the proximal fibula, negative syndesmosis squeeze.  She has 1+ laxity with anterior drawer in both ankles, no laxity with talar tilt.  Mild  tenderness over the ATFL today.  No subluxation of the peroneal tendons with resisted dorsiflexion and eversion.  Imaging: No results found.  Assessment & Plan: 1.  Right ankle lateral sprain, clinically healing -Continue with physical therapy.  Okay to return to tennis when able to sprint and pivot without limping.  Anticipate 2-3 more weeks.  Follow-up as needed.  2.  Right knee tricompartmental DJD -Brace during activity, glucosamine, cortisone injection if symptoms worsen versus viscosupplementation.     Procedures: No procedures performed  No notes on file     PMFS History: Patient Active Problem List   Diagnosis Date Noted  . Schatzki's ring 10/05/2018  . Gastroesophageal reflux disease 10/05/2018  . Colon cancer screening 10/05/2018  . History of esophagogastroduodenoscopy (EGD) 07/22/2018  . Dilation of esophagus 07/22/2018  . Esophageal dysphagia 07/22/2018  . Epicondylitis elbow, medial 12/15/2015   Past Medical History:  Diagnosis Date  . Epicondylitis elbow, medial   . Esophageal stricture     Family History  Problem Relation Age of Onset  . Hypertension Mother   . Cancer Sister 35       breast  . Breast cancer Sister   . Cancer Sister        breast  cancer  . Breast cancer Sister   . Colon cancer Neg Hx   . Esophageal cancer Neg Hx   . Inflammatory bowel disease Neg Hx   . Liver disease Neg Hx   . Pancreatic cancer Neg Hx   . Stomach cancer Neg Hx   . Rectal cancer Neg Hx     Past Surgical History:  Procedure Laterality Date  .  endoscopy with dilation    . UPPER GASTROINTESTINAL ENDOSCOPY     Social History   Occupational History  . Not on file  Tobacco Use  . Smoking status: Never Smoker  . Smokeless tobacco: Never Used  Substance and Sexual Activity  . Alcohol use: Yes    Alcohol/week: 0.0 standard drinks    Comment: socially  . Drug use: No  . Sexual activity: Yes    Birth control/protection: I.U.D.

## 2020-04-03 DIAGNOSIS — M25571 Pain in right ankle and joints of right foot: Secondary | ICD-10-CM | POA: Diagnosis not present

## 2020-04-10 DIAGNOSIS — M25571 Pain in right ankle and joints of right foot: Secondary | ICD-10-CM | POA: Diagnosis not present

## 2020-04-30 DIAGNOSIS — M9903 Segmental and somatic dysfunction of lumbar region: Secondary | ICD-10-CM | POA: Diagnosis not present

## 2020-04-30 DIAGNOSIS — M7701 Medial epicondylitis, right elbow: Secondary | ICD-10-CM | POA: Diagnosis not present

## 2020-04-30 DIAGNOSIS — M7711 Lateral epicondylitis, right elbow: Secondary | ICD-10-CM | POA: Diagnosis not present

## 2020-04-30 DIAGNOSIS — M9901 Segmental and somatic dysfunction of cervical region: Secondary | ICD-10-CM | POA: Diagnosis not present

## 2020-06-02 ENCOUNTER — Encounter: Payer: Self-pay | Admitting: Family Medicine

## 2020-06-02 ENCOUNTER — Other Ambulatory Visit: Payer: Self-pay

## 2020-06-02 ENCOUNTER — Ambulatory Visit: Payer: BLUE CROSS/BLUE SHIELD | Admitting: Family Medicine

## 2020-06-02 DIAGNOSIS — M25521 Pain in right elbow: Secondary | ICD-10-CM

## 2020-06-02 DIAGNOSIS — G8929 Other chronic pain: Secondary | ICD-10-CM | POA: Diagnosis not present

## 2020-06-02 DIAGNOSIS — M25561 Pain in right knee: Secondary | ICD-10-CM | POA: Diagnosis not present

## 2020-06-02 NOTE — Progress Notes (Signed)
I saw and examined the patient with Dr. Elouise Munroe and agree with assessment and plan as outlined.    Right knee pain improved with rest, but tennis is starting soon.  She's concerned that it might not hold up well.    Intermittent right lateral elbow pain, too.  Had chiropractic adjustment recently with some relief.  Exam shows no knee effusion; 2+ crepitus, no pain with compression.  Mild medial and lateral joint line tenderness.  Right elbow exam shows tenderness at radial tunnel.  No pain at epicondyle.    Will try glucosamine, elbow strengthening.  Cortisone injection in knee if worsens (or gel injections).

## 2020-06-02 NOTE — Progress Notes (Signed)
Office Visit Note   Patient: Erica David           Date of Birth: 1971-06-02           MRN: [id number] Visit Date: 06/02/2020 Requested by: Girtha Rm, NP-C [place],  [place] [id number] PCP: Girtha Rm, NP-C  Subjective: Chief Complaint  Patient presents with  . Right Knee - Pain    HPI: Patient is a 49 year old female presenting to clinic with concerns of right-sided knee pain, which is worsened over the past few months she has increased her activity in preparation for tennis season.  She states she has a history of osteoarthritis, for which she takes glucosamine supplementation and wears a knee brace while playing- which has offered some improvement.  Her primary concern today is trying to prevent a flare of her knee pain with upcoming tennis season.  She states that she is scared that she will do something to hurt herself and ultimately make her knee unmanageable in the future.  Her goal is to remain playing tennis for the foreseeable future, and she wants to know what she has to do to make this happen.  She denies any significant injury to her knee, though she did roll her ankle several months ago which took her out of play.  Additionally she has concerns about right-sided tennis elbow, which she was initially seen for chiropractor for several years ago during an initial flare.  She states this is somewhat returned with increasing activity in preparation for the new season.  It is not nearly as bad now as it was in the past, and she wants to make sure that it does not become a full flare again.  She would like to know what preventative measures she can take.              ROS:   All other systems were reviewed and are negative.  Objective: Vital Signs: There were no vitals taken for this visit.  Physical Exam:  General:  Alert and oriented, in no acute distress. Pulm:  Breathing unlabored. Psy:  Normal mood, congruent affect. Skin:  No rashes or  bruising appreciated.   RIGHT KNEE: No swelling, bruising, or deformity. Significant patellar crepitus appreciated with knee extension/flexion. Neurovascularly intact.  No tenderness with patellar compression, and negative patellar apprehension test. Some TTP along both medial and lateral joint lines.  No pain/laxity with anterior/posterior drawer, or varus/valgus stress on the knee. Negative McMurray.   Right Elbow with mild TTP approx 2cm distal to lat epicondyle. No significant pain with resisted finger extension, however some pain with resisted wrist extension. No pain with resisted pronation/supination. No tenderness on the lateral epicondyle itself.   Imaging: No results found.  Patient has MRI report from [place] system with her, with grade 4 chondromalacia patellae, tricompartmental OA. Intact meniscus, ACL/PCL.   Assessment & Plan: 49 year old female presented to clinic for chronic right knee pain, thought to be due to known osteoarthritis.  Examination as above, which is significant for patellar crepitus and joint line tenderness, consistent with this diagnosis.  MRI with grade 4 chondral defect, however this does not seem consistent with her current pain today.  Patient was encouraged to continue with her plan to play tennis, as exercise will help strengthen and support the knee.  If her pain worsens she may be an excellent candidate for viscosupplementation, intra-articular prolotherapy, or corticosteroid injection, depending on her degree of pain.  Per her chronic tennis  elbow, patient already has home exercise program prescribed by physical therapy in the past.  She was encouraged to resume these home exercises, and should her pain worsen she should return to clinic for reevaluation.  Patient expresses understanding and is happy with the plan of care today.  She has no further questions or concerns.     Procedures: No procedures performed  No notes on file     PMFS  History: Patient Active Problem List   Diagnosis Date Noted  . Schatzki's ring 10/05/2018  . Gastroesophageal reflux disease 10/05/2018  . Colon cancer screening 10/05/2018  . History of esophagogastroduodenoscopy (EGD) 07/22/2018  . Dilation of esophagus 07/22/2018  . Esophageal dysphagia 07/22/2018  . Epicondylitis elbow, medial 12/15/2015   Past Medical History:  Diagnosis Date  . Epicondylitis elbow, medial   . Esophageal stricture     Family History  Problem Relation Age of Onset  . Hypertension Mother   . Cancer Sister 66       breast  . Breast cancer Sister   . Cancer Sister        breast cancer  . Breast cancer Sister   . Colon cancer Neg Hx   . Esophageal cancer Neg Hx   . Inflammatory bowel disease Neg Hx   . Liver disease Neg Hx   . Pancreatic cancer Neg Hx   . Stomach cancer Neg Hx   . Rectal cancer Neg Hx     Past Surgical History:  Procedure Laterality Date  .  endoscopy with dilation    . UPPER GASTROINTESTINAL ENDOSCOPY     Social History   Occupational History  . Not on file  Tobacco Use  . Smoking status: Never Smoker  . Smokeless tobacco: Never Used  Vaping Use  . Vaping Use: Never used  Substance and Sexual Activity  . Alcohol use: Yes    Alcohol/week: 0.0 standard drinks    Comment: socially  . Drug use: No  . Sexual activity: Yes    Birth control/protection: I.U.D.

## 2020-06-09 ENCOUNTER — Other Ambulatory Visit: Payer: Self-pay | Admitting: Family Medicine

## 2020-06-09 DIAGNOSIS — Z1231 Encounter for screening mammogram for malignant neoplasm of breast: Secondary | ICD-10-CM

## 2020-06-18 ENCOUNTER — Ambulatory Visit
Admission: RE | Admit: 2020-06-18 | Discharge: 2020-06-18 | Disposition: A | Discharge status: Home / Self Care | Payer: BLUE CROSS/BLUE SHIELD | Source: Ambulatory Visit | Attending: Family Medicine | Admitting: Family Medicine

## 2020-06-18 ENCOUNTER — Other Ambulatory Visit: Payer: Self-pay

## 2020-06-18 DIAGNOSIS — Z1231 Encounter for screening mammogram for malignant neoplasm of breast: Secondary | ICD-10-CM

## 2020-07-06 NOTE — Progress Notes (Signed)
Subjective:    Patient ID: Erica David, female    DOB: 1971/03/22, 49 y.o.   MRN: [id number]  HPI Chief Complaint  Patient presents with  . fasting cpe    fasting cpe, sees obgyn   She is here for a complete physical exam. From [place] originally  Other providers: GI- Dr. Gregor Hams- Dr. Glenford Bayley [facility]- Paula Compton    Diet: fairly healthy  Excerise: regular. Plays competitive tennis   Lives with her husband.  Has 3 children.  Works as an Optometrist. Denies smoking or drug use. Occasional alcohol use  Immunizations: Up-to-date.  Counseling on Shingrix vaccine  Health maintenance:  Mammogram: 06/18/2020 Colonoscopy: never  Last Gynecological Exam: 08/2020 Has IUD Last Dental Exam: in the past month  Last Eye Exam: 10/2020  Wears seatbelt always, uses sunscreen, smoke detectors in home and functioning, does not text while driving and feels safe in home environment.   Reviewed allergies, medications, past medical, surgical, family, and social history.   Review of Systems Review of Systems Constitutional: -fever, -chills, -sweats, -unexpected weight change,-fatigue ENT: -runny nose, -ear pain, -sore throat Cardiology:  -chest pain, -palpitations, -edema Respiratory: -cough, -shortness of breath, -wheezing Gastroenterology: -abdominal pain, -nausea, -vomiting, -diarrhea, -constipation  Hematology: -bleeding or bruising problems Musculoskeletal: -arthralgias, -myalgias, -joint swelling, -back pain Ophthalmology: -vision changes Urology: -dysuria, -difficulty urinating, -hematuria, -urinary frequency, -urgency Neurology: -headache, -weakness, -tingling, -numbness       Objective:   Physical Exam BP 120/64   Pulse 68   Ht 5' 4.25" (1.632 m)   Wt 176 lb 3.2 oz (79.9 kg)   BMI 30.01 kg/m   General Appearance:    Alert, cooperative, no distress, appears stated age  Head:    Normocephalic, without obvious  abnormality, atraumatic  Eyes:    PERRL, conjunctiva/corneas clear, EOM's intact  Ears:    Normal TM's and external ear canals  Nose:  Mask in place  Throat:  Mask in place  Neck:   Supple, no lymphadenopathy;  thyroid:  no   enlargement/tenderness/nodules; no JVD  Back:    Spine nontender, no curvature, ROM normal, no CVA     tenderness  Lungs:     Clear to auscultation bilaterally without wheezes, rales or     ronchi; respirations unlabored  Chest Wall:    No tenderness or deformity   Heart:    Regular rate and rhythm, S1 and S2 normal, no murmur, rub   or gallop  Breast Exam:   OB/GYN  Abdomen:     Soft, non-tender, nondistended, normoactive bowel sounds,    no masses, no hepatosplenomegaly  Genitalia:   OB/GYN     Extremities:   No clubbing, cyanosis or edema  Pulses:   2+ and symmetric all extremities  Skin:   Skin color, texture, turgor normal, no rashes or lesions  Lymph nodes:   Cervical, supraclavicular, and axillary nodes normal  Neurologic:   CNII-XII intact, normal strength, sensation and gait; reflexes 2+ and symmetric throughout          Psych:   Normal mood, affect, hygiene and grooming.        Assessment & Plan:  Routine general medical examination at a health care facility - Plan: CBC with Differential/Platelet, Comprehensive metabolic panel, T4, free, TSH, Lipid panel -Preventive health care reviewed.  She sees her OB/GYN.  Counseled on healthy lifestyle including diet and exercise.  She appears to be taking good care of her self.  She  is in good spirits.  Immunizations reviewed.  Counseling on the Shingrix vaccine.  Discussed safety and health promotion.  Need for hepatitis C screening test - Plan: Hepatitis C antibody -Done per screening guidelines  Screen for colon cancer - Plan: Ambulatory referral to Gastroenterology -This will be her first screening colonoscopy  Screening for thyroid disorder - Plan: T4, free, TSH -Follow-up pending results  Screening for  lipid disorders - Plan: Lipid panel -Fat, low-cholesterol diet.  Exercise.  Follow-up pending results

## 2020-07-06 NOTE — Patient Instructions (Signed)
Preventive Care 40-51 Years Old, Female °Preventive care refers to visits with your health care provider and lifestyle choices that can promote health and wellness. This includes: °· A yearly physical exam. This may also be called an annual well check. °· Regular dental visits and eye exams. °· Immunizations. °· Screening for certain conditions. °· Healthy lifestyle choices, such as eating a healthy diet, getting regular exercise, not using drugs or products that contain nicotine and tobacco, and limiting alcohol use. °What can I expect for my preventive care visit? °Physical exam °Your health care provider will check your: °· Height and weight. This may be used to calculate body mass index (BMI), which tells if you are at a healthy weight. °· Heart rate and blood pressure. °· Skin for abnormal spots. °Counseling °Your health care provider may ask you questions about your: °· Alcohol, tobacco, and drug use. °· Emotional well-being. °· Home and relationship well-being. °· Sexual activity. °· Eating habits. °· Work and work environment. °· Method of birth control. °· Menstrual cycle. °· Pregnancy history. °What immunizations do I need? ° °Influenza (flu) vaccine °· This is recommended every year. °Tetanus, diphtheria, and pertussis (Tdap) vaccine °· You may need a Td booster every 10 years. °Varicella (chickenpox) vaccine °· You may need this if you have not been vaccinated. °Zoster (shingles) vaccine °· You may need this after age 60. °Measles, mumps, and rubella (MMR) vaccine °· You may need at least one dose of MMR if you were born in 1957 or later. You may also need a second dose. °Pneumococcal conjugate (PCV13) vaccine °· You may need this if you have certain conditions and were not previously vaccinated. °Pneumococcal polysaccharide (PPSV23) vaccine °· You may need one or two doses if you smoke cigarettes or if you have certain conditions. °Meningococcal conjugate (MenACWY) vaccine °· You may need this if you  have certain conditions. °Hepatitis A vaccine °· You may need this if you have certain conditions or if you travel or work in places where you may be exposed to hepatitis A. °Hepatitis B vaccine °· You may need this if you have certain conditions or if you travel or work in places where you may be exposed to hepatitis B. °Haemophilus influenzae type b (Hib) vaccine °· You may need this if you have certain conditions. °Human papillomavirus (HPV) vaccine °· If recommended by your health care provider, you may need three doses over 6 months. °You may receive vaccines as individual doses or as more than one vaccine together in one shot (combination vaccines). Talk with your health care provider about the risks and benefits of combination vaccines. °What tests do I need? °Blood tests °· Lipid and cholesterol levels. These may be checked every 5 years, or more frequently if you are over 50 years old. °· Hepatitis C test. °· Hepatitis B test. °Screening °· Lung cancer screening. You may have this screening every year starting at age 55 if you have a 30-pack-year history of smoking and currently smoke or have quit within the past 15 years. °· Colorectal cancer screening. All adults should have this screening starting at age 50 and continuing until age 75. Your health care provider may recommend screening at age 45 if you are at increased risk. You will have tests every 1-10 years, depending on your results and the type of screening test. °· Diabetes screening. This is done by checking your blood sugar (glucose) after you have not eaten for a while (fasting). You may have this   done every 1-3 years.  Mammogram. This may be done every 1-2 years. Talk with your health care provider about when you should start having regular mammograms. This may depend on whether you have a family history of breast cancer.  BRCA-related cancer screening. This may be done if you have a family history of breast, ovarian, tubal, or peritoneal  cancers.  Pelvic exam and Pap test. This may be done every 3 years starting at age 76. Starting at age 89, this may be done every 5 years if you have a Pap test in combination with an HPV test. Other tests  Sexually transmitted disease (STD) testing.  Bone density scan. This is done to screen for osteoporosis. You may have this scan if you are at high risk for osteoporosis. Follow these instructions at home: Eating and drinking  Eat a diet that includes fresh fruits and vegetables, whole grains, lean protein, and low-fat dairy.  Take vitamin and mineral supplements as recommended by your health care provider.  Do not drink alcohol if: ? Your health care provider tells you not to drink. ? You are pregnant, may be pregnant, or are planning to become pregnant.  If you drink alcohol: ? Limit how much you have to 0-1 drink a day. ? Be aware of how much alcohol is in your drink. In the [place] one drink equals one 12 oz bottle of beer (355 mL), one 5 oz glass of wine (148 mL), or one 1 oz glass of hard liquor (44 mL). Lifestyle  Take daily care of your teeth and gums.  Stay active. Exercise for at least 30 minutes on 5 or more days each week.  Do not use any products that contain nicotine or tobacco, such as cigarettes, e-cigarettes, and chewing tobacco. If you need help quitting, ask your health care provider.  If you are sexually active, practice safe sex. Use a condom or other form of birth control (contraception) in order to prevent pregnancy and STIs (sexually transmitted infections).  If told by your health care provider, take low-dose aspirin daily starting at age 37. What's next?  Visit your health care provider once a year for a well check visit.  Ask your health care provider how often you should have your eyes and teeth checked.  Stay up to date on all vaccines. This information is not intended to replace advice given to you by your health care provider. Make sure you  discuss any questions you have with your health care provider. Document Revised: 07/13/2018 Document Reviewed: 07/13/2018 Elsevier Patient Education  2020 [employer].

## 2020-07-07 ENCOUNTER — Encounter: Payer: Self-pay | Admitting: Family Medicine

## 2020-07-07 ENCOUNTER — Other Ambulatory Visit: Payer: Self-pay

## 2020-07-07 ENCOUNTER — Ambulatory Visit: Payer: BLUE CROSS/BLUE SHIELD | Admitting: Family Medicine

## 2020-07-07 VITALS — BP 120/64 | HR 68 | Ht 64.25 in | Wt 176.2 lb

## 2020-07-07 DIAGNOSIS — Z1329 Encounter for screening for other suspected endocrine disorder: Secondary | ICD-10-CM

## 2020-07-07 DIAGNOSIS — Z13220 Encounter for screening for lipoid disorders: Secondary | ICD-10-CM

## 2020-07-07 DIAGNOSIS — Z1159 Encounter for screening for other viral diseases: Secondary | ICD-10-CM | POA: Diagnosis not present

## 2020-07-07 DIAGNOSIS — Z0000 Encounter for general adult medical examination without abnormal findings: Secondary | ICD-10-CM

## 2020-07-07 DIAGNOSIS — Z1211 Encounter for screening for malignant neoplasm of colon: Secondary | ICD-10-CM | POA: Diagnosis not present

## 2020-07-08 LAB — COMPREHENSIVE METABOLIC PANEL
ALT: 7 IU/L (ref 0–32)
AST: 21 IU/L (ref 0–40)
Albumin/Globulin Ratio: 1.8 (ref 1.2–2.2)
Albumin: 4.4 g/dL (ref 3.8–4.8)
Alkaline Phosphatase: 80 IU/L (ref 48–121)
BUN/Creatinine Ratio: 14 (ref 9–23)
BUN: 12 mg/dL (ref 6–24)
Bilirubin Total: 0.5 mg/dL (ref 0.0–1.2)
CO2: 23 mmol/L (ref 20–29)
Calcium: 9.6 mg/dL (ref 8.7–10.2)
Chloride: 104 mmol/L (ref 96–106)
Creatinine, Ser: 0.86 mg/dL (ref 0.57–1.00)
GFR calc Af Amer: 91 mL/min/{1.73_m2} (ref >59)
GFR calc non Af Amer: 79 mL/min/{1.73_m2} (ref >59)
Globulin, Total: 2.4 g/dL (ref 1.5–4.5)
Glucose: 81 mg/dL (ref 65–99)
Potassium: 4.5 mmol/L (ref 3.5–5.2)
Sodium: 141 mmol/L (ref 134–144)
Total Protein: 6.8 g/dL (ref 6.0–8.5)

## 2020-07-08 LAB — CBC WITH DIFFERENTIAL/PLATELET
Basophils Absolute: 0 10*3/uL (ref 0.0–0.2)
Basos: 1 %
EOS (ABSOLUTE): 0.1 10*3/uL (ref 0.0–0.4)
Eos: 1 %
Hematocrit: 41.4 % (ref 34.0–46.6)
Hemoglobin: 13.9 g/dL (ref 11.1–15.9)
Immature Grans (Abs): 0 10*3/uL (ref 0.0–0.1)
Immature Granulocytes: 0 %
Lymphocytes Absolute: 1.5 10*3/uL (ref 0.7–3.1)
Lymphs: 22 %
MCH: 31 pg (ref 26.6–33.0)
MCHC: 33.6 g/dL (ref 31.5–35.7)
MCV: 92 fL (ref 79–97)
Monocytes Absolute: 0.6 10*3/uL (ref 0.1–0.9)
Monocytes: 8 %
Neutrophils Absolute: 4.6 10*3/uL (ref 1.4–7.0)
Neutrophils: 68 %
Platelets: 230 10*3/uL (ref 150–450)
RBC: 4.48 x10E6/uL (ref 3.77–5.28)
RDW: 12.8 % (ref 11.7–15.4)
WBC: 6.7 10*3/uL (ref 3.4–10.8)

## 2020-07-08 LAB — LIPID PANEL
Chol/HDL Ratio: 3.2 ratio (ref 0.0–4.4)
Cholesterol, Total: 171 mg/dL (ref 100–199)
HDL: 53 mg/dL (ref >39)
LDL Chol Calc (NIH): 101 mg/dL — ABNORMAL HIGH (ref 0–99)
Triglycerides: 90 mg/dL (ref 0–149)
VLDL Cholesterol Cal: 17 mg/dL (ref 5–40)

## 2020-07-08 LAB — HEPATITIS C ANTIBODY: Hep C Virus Ab: 0.2 s/co ratio (ref 0.0–0.9)

## 2020-07-08 LAB — TSH: TSH: 3.28 u[IU]/mL (ref 0.450–4.500)

## 2020-07-08 LAB — T4, FREE: Free T4: 1.3 ng/dL (ref 0.82–1.77)

## 2020-09-09 ENCOUNTER — Telehealth: Payer: Self-pay | Admitting: Family Medicine

## 2020-09-09 NOTE — Telephone Encounter (Signed)
Quested records received from [facility]

## 2020-09-10 ENCOUNTER — Encounter: Payer: Self-pay | Admitting: Family Medicine

## 2020-10-16 DIAGNOSIS — Z1389 Encounter for screening for other disorder: Secondary | ICD-10-CM | POA: Diagnosis not present

## 2020-10-16 DIAGNOSIS — Z6829 Body mass index (BMI) 29.0-29.9, adult: Secondary | ICD-10-CM | POA: Diagnosis not present

## 2020-10-16 DIAGNOSIS — Z01419 Encounter for gynecological examination (general) (routine) without abnormal findings: Secondary | ICD-10-CM | POA: Diagnosis not present

## 2020-10-16 DIAGNOSIS — Z130 Encounter for screening for diseases of the blood and blood-forming organs and certain disorders involving the immune mechanism: Secondary | ICD-10-CM | POA: Diagnosis not present

## 2020-10-17 ENCOUNTER — Ambulatory Visit: Payer: BLUE CROSS/BLUE SHIELD | Attending: Internal Medicine

## 2020-10-17 DIAGNOSIS — Z23 Encounter for immunization: Secondary | ICD-10-CM

## 2020-10-17 NOTE — Progress Notes (Signed)
   Covid-19 Vaccination Clinic  Name:  Anntoinette Haefele    MRN: [id number] DOB: 1971-05-24  10/17/2020  Ms. Reino was observed post Covid-19 immunization for 15 minutes without incident. She was provided with Vaccine Information Sheet and instruction to access the V-Safe system.   Ms. Lorusso was instructed to call 911 with any severe reactions post vaccine: Marland Kitchen Difficulty breathing  . Swelling of face and throat  . A fast heartbeat  . A bad rash all over body  . Dizziness and weakness   Immunizations Administered    Name Date Dose VIS Date Route   Pfizer COVID-19 Vaccine 10/17/2020  2:27 PM 0.3 mL 09/03/2020 Intramuscular   Manufacturer: [employer]   Lot: [id number]   NDC: [address]-2

## 2020-10-24 ENCOUNTER — Encounter: Payer: Self-pay | Admitting: Family Medicine

## 2020-10-24 ENCOUNTER — Encounter: Payer: Self-pay | Admitting: Internal Medicine

## 2020-10-31 DIAGNOSIS — H524 Presbyopia: Secondary | ICD-10-CM | POA: Diagnosis not present

## 2020-10-31 DIAGNOSIS — H04123 Dry eye syndrome of bilateral lacrimal glands: Secondary | ICD-10-CM | POA: Diagnosis not present

## 2020-11-21 ENCOUNTER — Other Ambulatory Visit: Payer: BLUE CROSS/BLUE SHIELD

## 2020-12-30 DIAGNOSIS — B079 Viral wart, unspecified: Secondary | ICD-10-CM | POA: Diagnosis not present

## 2020-12-30 DIAGNOSIS — D485 Neoplasm of uncertain behavior of skin: Secondary | ICD-10-CM | POA: Diagnosis not present

## 2021-05-13 ENCOUNTER — Encounter: Payer: Self-pay | Admitting: Internal Medicine

## 2021-07-14 ENCOUNTER — Encounter: Payer: BLUE CROSS/BLUE SHIELD | Admitting: Family Medicine

## 2021-07-28 ENCOUNTER — Other Ambulatory Visit: Payer: Self-pay

## 2021-07-28 ENCOUNTER — Ambulatory Visit: Payer: BLUE CROSS/BLUE SHIELD | Admitting: Orthopaedic Surgery

## 2021-07-28 ENCOUNTER — Ambulatory Visit: Payer: Self-pay

## 2021-07-28 ENCOUNTER — Encounter: Payer: Self-pay | Admitting: Orthopaedic Surgery

## 2021-07-28 VITALS — Ht 64.25 in | Wt 176.0 lb

## 2021-07-28 DIAGNOSIS — M1711 Unilateral primary osteoarthritis, right knee: Secondary | ICD-10-CM | POA: Diagnosis not present

## 2021-07-28 MED ORDER — DICLOFENAC SODIUM 2 % EX SOLN: 2.0000 g | Freq: Two times a day (BID) | CUTANEOUS | 3 refills | Status: DC | PRN

## 2021-07-28 NOTE — Progress Notes (Signed)
Office Visit Note   Patient: Erica David           Date of Birth: 30-Mar-1971           MRN: [id number] Visit Date: 07/28/2021              Requested by: Girtha Rm, NP-C [place],  [place] [address] PCP: Girtha Rm, NP-C   Assessment & Plan: Visit Diagnoses:  1. Primary osteoarthritis of right knee     Plan: Based on findings impression is mild right knee osteoarthritis.  Treatment options were discussed to include NSAIDs, cortisone injections, activity modifications and relative rest.  Do not get any sense that she has a structural problem in her knee.  May want to consider knee brace during activity.  I do think that Pennsaid would be great medication to use before and after activity.  Questions encouraged and answered.  Follow-Up Instructions: Return if symptoms worsen or fail to improve.   Orders:  Orders Placed This Encounter  Procedures   XR KNEE 3 VIEW RIGHT   Meds ordered this encounter  Medications   Diclofenac Sodium (PENNSAID) 2 % SOLN    Sig: Apply 2 g topically 2 (two) times daily as needed (to affected area).    Dispense:  112 g    Refill:  3      Procedures: No procedures performed   Clinical Data: No additional findings.   Subjective: Chief Complaint  Patient presents with   Right Knee - Pain    HPI  Sonia Baller is a very pleasant 50 year old female who I know from [place] who comes in for chronic right knee pain for years.  Denies any injuries.  She definitely notices increased symptoms with increased activity and tennis.  She has had a prior MRI done in [place] in 2018 which showed chondromalacia per the patient.  She has not had any injections or mechanical symptoms or prior surgeries and she really does not take any medications on a regular basis.  Mainly here for reassurance that she can continue her activities.  Review of Systems   Objective: Vital Signs: Ht 5' 4.25" (1.632 m)   Wt 176  lb (79.8 kg)   BMI 29.98 kg/m   Physical Exam  Ortho Exam  Right knee shows full range of motion without significant crepitus.  Collaterals and cruciates are stable.  No joint line tenderness.  No joint effusion.  No popliteal masses.  Specialty Comments:  No specialty comments available.  Imaging: XR KNEE 3 VIEW RIGHT  Result Date: 07/28/2021 Mild osteoarthritis with periarticular spurring.    PMFS History: Patient Active Problem List   Diagnosis Date Noted   Schatzki's ring 10/05/2018   Gastroesophageal reflux disease 10/05/2018   Colon cancer screening 10/05/2018   History of esophagogastroduodenoscopy (EGD) 07/22/2018   Dilation of esophagus 07/22/2018   Esophageal dysphagia 07/22/2018   Epicondylitis elbow, medial 12/15/2015   Past Medical History:  Diagnosis Date   Epicondylitis elbow, medial    Esophageal stricture     Family History  Problem Relation Age of Onset   Hypertension Mother    Cancer Sister 11       breast   Breast cancer Sister    Cancer Sister        breast cancer   Breast cancer Sister    Colon cancer Neg Hx    Esophageal cancer Neg Hx    Inflammatory bowel disease Neg Hx  Liver disease Neg Hx    Pancreatic cancer Neg Hx    Stomach cancer Neg Hx    Rectal cancer Neg Hx     Past Surgical History:  Procedure Laterality Date    endoscopy with dilation     UPPER GASTROINTESTINAL ENDOSCOPY     Social History   Occupational History   Not on file  Tobacco Use   Smoking status: Never   Smokeless tobacco: Never  Vaping Use   Vaping Use: Never used  Substance and Sexual Activity   Alcohol use: Yes    Alcohol/week: 0.0 standard drinks    Comment: socially   Drug use: No   Sexual activity: Yes    Birth control/protection: I.U.D.

## 2021-08-11 ENCOUNTER — Other Ambulatory Visit: Payer: Self-pay | Admitting: Family Medicine

## 2021-08-11 DIAGNOSIS — Z1231 Encounter for screening mammogram for malignant neoplasm of breast: Secondary | ICD-10-CM

## 2021-08-11 NOTE — Patient Instructions (Signed)
Preventive Care 40-52 Years Old, Female Preventive care refers to lifestyle choices and visits with your health care provider that can promote health and wellness. This includes: A yearly physical exam. This is also called an annual wellness visit. Regular dental and eye exams. Immunizations. Screening for certain conditions. Healthy lifestyle choices, such as: Eating a healthy diet. Getting regular exercise. Not using drugs or products that contain nicotine and tobacco. Limiting alcohol use. What can I expect for my preventive care visit? Physical exam Your health care provider will check your: Height and weight. These may be used to calculate your BMI (body mass index). BMI is a measurement that tells if you are at a healthy weight. Heart rate and blood pressure. Body temperature. Skin for abnormal spots. Counseling Your health care provider may ask you questions about your: Past medical problems. Family's medical history. Alcohol, tobacco, and drug use. Emotional well-being. Home life and relationship well-being. Sexual activity. Diet, exercise, and sleep habits. Work and work environment. Access to firearms. Method of birth control. Menstrual cycle. Pregnancy history. What immunizations do I need? Vaccines are usually given at various ages, according to a schedule. Your health care provider will recommend vaccines for you based on your age, medical history, and lifestyle or other factors, such as travel or where you work. What tests do I need? Blood tests Lipid and cholesterol levels. These may be checked every 5 years, or more often if you are over 50 years old. Hepatitis C test. Hepatitis B test. Screening Lung cancer screening. You may have this screening every year starting at age 55 if you have a 30-pack-year history of smoking and currently smoke or have quit within the past 15 years. Colorectal cancer screening. All adults should have this screening starting at  age 50 and continuing until age 75. Your health care provider may recommend screening at age 45 if you are at increased risk. You will have tests every 1-10 years, depending on your results and the type of screening test. Diabetes screening. This is done by checking your blood sugar (glucose) after you have not eaten for a while (fasting). You may have this done every 1-3 years. Mammogram. This may be done every 1-2 years. Talk with your health care provider about when you should start having regular mammograms. This may depend on whether you have a family history of breast cancer. BRCA-related cancer screening. This may be done if you have a family history of breast, ovarian, tubal, or peritoneal cancers. Pelvic exam and Pap test. This may be done every 3 years starting at age 21. Starting at age 30, this may be done every 5 years if you have a Pap test in combination with an HPV test. Other tests STD (sexually transmitted disease) testing, if you are at risk. Bone density scan. This is done to screen for osteoporosis. You may have this scan if you are at high risk for osteoporosis. Talk with your health care provider about your test results, treatment options, and if necessary, the need for more tests. Follow these instructions at home: Eating and drinking  Eat a diet that includes fresh fruits and vegetables, whole grains, lean protein, and low-fat dairy products. Take vitamin and mineral supplements as recommended by your health care provider. Do not drink alcohol if: Your health care provider tells you not to drink. You are pregnant, may be pregnant, or are planning to become pregnant. If you drink alcohol: Limit how much you have to 0-1 drink a day. Be   aware of how much alcohol is in your drink. In the [place] one drink equals one 12 oz bottle of beer (355 mL), one 5 oz glass of wine (148 mL), or one 1 oz glass of hard liquor (44 mL). Lifestyle Take daily care of your teeth and  gums. Brush your teeth every morning and night with fluoride toothpaste. Floss one time each day. Stay active. Exercise for at least 30 minutes 5 or more days each week. Do not use any products that contain nicotine or tobacco, such as cigarettes, e-cigarettes, and chewing tobacco. If you need help quitting, ask your health care provider. Do not use drugs. If you are sexually active, practice safe sex. Use a condom or other form of protection to prevent STIs (sexually transmitted infections). If you do not wish to become pregnant, use a form of birth control. If you plan to become pregnant, see your health care provider for a prepregnancy visit. If told by your health care provider, take low-dose aspirin daily starting at age 63. Find healthy ways to cope with stress, such as: Meditation, yoga, or listening to music. Journaling. Talking to a trusted person. Spending time with friends and family. Safety Always wear your seat belt while driving or riding in a vehicle. Do not drive: If you have been drinking alcohol. Do not ride with someone who has been drinking. When you are tired or distracted. While texting. Wear a helmet and other protective equipment during sports activities. If you have firearms in your house, make sure you follow all gun safety procedures. What's next? Visit your health care provider once a year for an annual wellness visit. Ask your health care provider how often you should have your eyes and teeth checked. Stay up to date on all vaccines. This information is not intended to replace advice given to you by your health care provider. Make sure you discuss any questions you have with your health care provider. Document Revised: 01/09/2021 Document Reviewed: 07/13/2018 Elsevier Patient Education  2022 [employer].

## 2021-08-11 NOTE — Progress Notes (Signed)
Subjective:    Patient ID: Erica David, female    DOB: [birth year], 49 y.o.   MRN: [id number]  HPI Chief Complaint  Patient presents with   Annual Exam   She is here for a complete physical exam.  Other providers: [facility]/GYN- [facility]  GI- [place]   Diet: healthy  Excerise: tennis    Health maintenance:   Mammogram: scheduled August 20 2021 Colonoscopy: overdue  Last Gynecological Exam: UTD  Last Dental Exam: UTD  Last Eye Exam: December 2021  Wears seatbelt always, uses sunscreen, smoke detectors in home and functioning, does not text while driving and feels safe in home environment.   Reviewed allergies, medications, past medical, surgical, family, and social history.    Review of Systems Review of Systems Constitutional: -fever, -chills, -sweats, -unexpected weight change,-fatigue ENT: -runny nose, -ear pain, -sore throat Cardiology:  -chest pain, -palpitations, -edema Respiratory: -cough, -shortness of breath, -wheezing Gastroenterology: -abdominal pain, -nausea, -vomiting, -diarrhea, -constipation  Hematology: -bleeding or bruising problems Musculoskeletal: -arthralgias, -myalgias, -joint swelling, -back pain Ophthalmology: -vision changes Urology: -dysuria, -difficulty urinating, -hematuria, -urinary frequency, -urgency Neurology: -headache, -weakness, -tingling, -numbness       Objective:   Physical Exam BP 138/90   Pulse 66   Ht 5\' 4"  (1.626 m)   Wt 189 lb (85.7 kg)   SpO2 98%   BMI 32.44 kg/m   General Appearance:    Alert, cooperative, no distress, appears stated age  Head:    Normocephalic, without obvious abnormality, atraumatic  Eyes:    PERRL, conjunctiva/corneas clear, EOM's intact  Ears:    Normal TM's and external ear canals  Nose:   Mask on   Throat:   Mask on   Neck:   Supple, no lymphadenopathy;  thyroid:  no   enlargement/tenderness/nodules; no JVD  Back:    Spine nontender, no curvature, ROM normal, no CVA      tenderness  Lungs:     Clear to auscultation bilaterally without wheezes, rales or     ronchi; respirations unlabored  Chest Wall:    No tenderness or deformity   Heart:    Regular rate and rhythm, S1 and S2 normal, no murmur, rub   or gallop  Breast Exam:    OB/GYN  Abdomen:     Soft, non-tender, nondistended, normoactive bowel sounds,    no masses, no hepatosplenomegaly  Genitalia:    OB/GYN     Extremities:   No clubbing, cyanosis or edema  Pulses:   2+ and symmetric all extremities  Skin:   Skin color, texture, turgor normal, no rashes or lesions  Lymph nodes:   Cervical, supraclavicular, and axillary nodes normal  Neurologic:   CNII-XII intact, normal strength, sensation and gait          Psych:   Normal mood, affect, hygiene and grooming.        Assessment & Plan:  Routine general medical examination at a health care facility - Plan: CBC with Differential/Platelet, Comprehensive metabolic panel, TSH, T4, free, T3, Lipid panel -Preventive health care reviewed.  She sees an OB/GYN.  Plans to schedule with a new one soon.  Referral to GI for her screening colonoscopy, she did not get this done last year.  Recommend she continue with a healthy diet and getting plenty of physical activity as she is.  She is in good spirits.  Immunizations reviewed.  She may return for her Shingrix vaccine and her COVID booster at her convenience and we discussed  that they should be 2 weeks apart at least.  Hep B vaccine needed today.  Discussed safety health promotion.  Colon cancer screening - Plan: Ambulatory referral to Gastroenterology  Screening for thyroid disorder - Plan: TSH, T4, free, T3  Screening for lipid disorders - Plan: Lipid panel

## 2021-08-12 ENCOUNTER — Ambulatory Visit: Payer: BLUE CROSS/BLUE SHIELD | Admitting: Family Medicine

## 2021-08-12 ENCOUNTER — Other Ambulatory Visit: Payer: Self-pay

## 2021-08-12 ENCOUNTER — Encounter: Payer: Self-pay | Admitting: Family Medicine

## 2021-08-12 VITALS — BP 138/90 | HR 66 | Ht 64.0 in | Wt 189.0 lb

## 2021-08-12 DIAGNOSIS — Z1329 Encounter for screening for other suspected endocrine disorder: Secondary | ICD-10-CM | POA: Diagnosis not present

## 2021-08-12 DIAGNOSIS — Z23 Encounter for immunization: Secondary | ICD-10-CM

## 2021-08-12 DIAGNOSIS — Z13220 Encounter for screening for lipoid disorders: Secondary | ICD-10-CM

## 2021-08-12 DIAGNOSIS — Z0000 Encounter for general adult medical examination without abnormal findings: Secondary | ICD-10-CM

## 2021-08-12 DIAGNOSIS — Z1211 Encounter for screening for malignant neoplasm of colon: Secondary | ICD-10-CM

## 2021-08-12 NOTE — Addendum Note (Signed)
Addended by: Sheilah Pigeon A on: 08/12/2021 02:33 PM   Modules accepted: Orders

## 2021-08-13 LAB — COMPREHENSIVE METABOLIC PANEL
ALT: 7 IU/L (ref 0–32)
AST: 21 IU/L (ref 0–40)
Albumin/Globulin Ratio: 1.8 (ref 1.2–2.2)
Albumin: 4.4 g/dL (ref 3.8–4.9)
Alkaline Phosphatase: 68 IU/L (ref 44–121)
BUN/Creatinine Ratio: 17 (ref 9–23)
BUN: 12 mg/dL (ref 6–24)
Bilirubin Total: 0.3 mg/dL (ref 0.0–1.2)
CO2: 22 mmol/L (ref 20–29)
Calcium: 9.2 mg/dL (ref 8.7–10.2)
Chloride: 105 mmol/L (ref 96–106)
Creatinine, Ser: 0.71 mg/dL (ref 0.57–1.00)
Globulin, Total: 2.4 g/dL (ref 1.5–4.5)
Glucose: 84 mg/dL (ref 70–99)
Potassium: 3.9 mmol/L (ref 3.5–5.2)
Sodium: 141 mmol/L (ref 134–144)
Total Protein: 6.8 g/dL (ref 6.0–8.5)
eGFR: 102 mL/min/{1.73_m2} (ref >59)

## 2021-08-13 LAB — CBC WITH DIFFERENTIAL/PLATELET
Basophils Absolute: 0 10*3/uL (ref 0.0–0.2)
Basos: 1 %
EOS (ABSOLUTE): 0.1 10*3/uL (ref 0.0–0.4)
Eos: 1 %
Hematocrit: 39.1 % (ref 34.0–46.6)
Hemoglobin: 13.1 g/dL (ref 11.1–15.9)
Immature Grans (Abs): 0 10*3/uL (ref 0.0–0.1)
Immature Granulocytes: 0 %
Lymphocytes Absolute: 1.7 10*3/uL (ref 0.7–3.1)
Lymphs: 24 %
MCH: 30.1 pg (ref 26.6–33.0)
MCHC: 33.5 g/dL (ref 31.5–35.7)
MCV: 90 fL (ref 79–97)
Monocytes Absolute: 0.6 10*3/uL (ref 0.1–0.9)
Monocytes: 8 %
Neutrophils Absolute: 4.8 10*3/uL (ref 1.4–7.0)
Neutrophils: 66 %
Platelets: 221 10*3/uL (ref 150–450)
RBC: 4.35 x10E6/uL (ref 3.77–5.28)
RDW: 12.7 % (ref 11.7–15.4)
WBC: 7.2 10*3/uL (ref 3.4–10.8)

## 2021-08-13 LAB — LIPID PANEL
Chol/HDL Ratio: 3.3 ratio (ref 0.0–4.4)
Cholesterol, Total: 187 mg/dL (ref 100–199)
HDL: 57 mg/dL (ref >39)
LDL Chol Calc (NIH): 116 mg/dL — ABNORMAL HIGH (ref 0–99)
Triglycerides: 76 mg/dL (ref 0–149)
VLDL Cholesterol Cal: 14 mg/dL (ref 5–40)

## 2021-08-13 LAB — T3: T3, Total: 101 ng/dL (ref 71–180)

## 2021-08-13 LAB — T4, FREE: Free T4: 1.2 ng/dL (ref 0.82–1.77)

## 2021-08-13 LAB — TSH: TSH: 3.41 u[IU]/mL (ref 0.450–4.500)

## 2021-08-20 ENCOUNTER — Other Ambulatory Visit: Payer: Self-pay

## 2021-08-20 ENCOUNTER — Ambulatory Visit
Admission: RE | Admit: 2021-08-20 | Discharge: 2021-08-20 | Disposition: A | Discharge status: Home / Self Care | Payer: BLUE CROSS/BLUE SHIELD | Source: Ambulatory Visit | Attending: Family Medicine | Admitting: Family Medicine

## 2021-08-20 DIAGNOSIS — Z1231 Encounter for screening mammogram for malignant neoplasm of breast: Secondary | ICD-10-CM | POA: Diagnosis not present

## 2021-08-26 ENCOUNTER — Other Ambulatory Visit: Payer: Self-pay

## 2021-08-26 ENCOUNTER — Ambulatory Visit: Payer: BLUE CROSS/BLUE SHIELD

## 2021-08-26 DIAGNOSIS — Z23 Encounter for immunization: Secondary | ICD-10-CM

## 2021-09-21 ENCOUNTER — Ambulatory Visit: Payer: BLUE CROSS/BLUE SHIELD | Admitting: Orthopedic Surgery

## 2021-09-21 ENCOUNTER — Other Ambulatory Visit: Payer: Self-pay

## 2021-09-21 DIAGNOSIS — S86811A Strain of other muscle(s) and tendon(s) at lower leg level, right leg, initial encounter: Secondary | ICD-10-CM | POA: Diagnosis not present

## 2021-09-24 ENCOUNTER — Other Ambulatory Visit: Payer: Self-pay

## 2021-09-24 MED ORDER — OMEPRAZOLE 40 MG PO CPDR: 40.0000 mg | DELAYED_RELEASE_CAPSULE | Freq: Every day | ORAL | 1 refills | Status: DC

## 2021-09-27 ENCOUNTER — Encounter: Payer: Self-pay | Admitting: Orthopedic Surgery

## 2021-09-27 NOTE — Progress Notes (Signed)
Office Visit Note   Patient: Erica David           Date of Birth: 07-Jun-1971           MRN: [id number] Visit Date: 09/21/2021 Requested by: Girtha Rm, PA-C No address on file PCP: Davy Pique  Subjective: Chief Complaint  Patient presents with   Right Leg - Pain    HPI: Erica David is a 50 year old patient with right leg pain.  She injured her right leg playing tennis.  Date of injury 09/15/2021.  Feels a knot in the proximal aspect of the right calf primarily on the slightly medial aspect of the calf.  Has not really played tennis return to much working out since that time.  She states that her foot slid at the end of a super tie breaker.  Hard for her to run after that.  She was limping the following day.  She is able to navigate stairs but still feels it at times in that proximal calf region.  No medications except occasional Aleve.  She likes to do burn [facility] which is a very vigorous exercise program.              ROS: All systems reviewed are negative as they relate to the chief complaint within the history of present illness.  Patient denies  fevers or chills.   Assessment & Plan: Visit Diagnoses: No diagnosis found.  Plan: Impression is right calf strain which is more proximal than the typical location for "tennis leg".  Achilles tendon is functional.  Ultrasound exam demonstrates some swelling and evidence of hematoma in the proximal right calf muscle.  Area of injury is around 3 x 4 cm.  No masses in this region.  Plan is gradual return to full activity and play as pain allows.  I think this could take about 2 to 3 weeks to fully recover.  Encouraged stretching as well as potentially rolling out that muscle some to help resolve some of the inflammation and hematoma.  Follow-up if not 50% improved by 3 weeks.  Follow-Up Instructions: No follow-ups on file.   Orders:  No orders of the defined types were placed in this encounter.  No orders of the  defined types were placed in this encounter.     Procedures: No procedures performed   Clinical Data: No additional findings.  Objective: Vital Signs: There were no vitals taken for this visit.  Physical Exam:   Constitutional: Patient appears well-developed HEENT:  Head: Normocephalic Eyes:EOM are normal Neck: Normal range of motion Cardiovascular: Normal rate Pulmonary/chest: Effort normal Neurologic: Patient is alert Skin: Skin is warm Psychiatric: Patient has normal mood and affect   Ortho Exam: Ortho exam demonstrates normal gait alignment.  Patient has normal Contours bilaterally when she stands on her toes.  No bruising or ecchymosis present.  She does have focal nodular tenderness around the proximal medial aspect of the calf about handbreadth below the knee flexion crease posteriorly.  Compartments are soft.  Knee range of motion is full.  Specialty Comments:  No specialty comments available.  Imaging: No results found.   PMFS History: Patient Active Problem List   Diagnosis Date Noted   Schatzki's ring 10/05/2018   Gastroesophageal reflux disease 10/05/2018   Colon cancer screening 10/05/2018   History of esophagogastroduodenoscopy (EGD) 07/22/2018   Dilation of esophagus 07/22/2018   Esophageal dysphagia 07/22/2018   Epicondylitis elbow, medial 12/15/2015   Past Medical History:  Diagnosis Date  Epicondylitis elbow, medial    Esophageal stricture     Family History  Problem Relation Age of Onset   Hypertension Mother    Cancer Sister 5       breast   Breast cancer Sister    Cancer Sister        breast cancer   Breast cancer Sister    Colon cancer Neg Hx    Esophageal cancer Neg Hx    Inflammatory bowel disease Neg Hx    Liver disease Neg Hx    Pancreatic cancer Neg Hx    Stomach cancer Neg Hx    Rectal cancer Neg Hx     Past Surgical History:  Procedure Laterality Date    endoscopy with dilation     UPPER GASTROINTESTINAL ENDOSCOPY      Social History   Occupational History   Not on file  Tobacco Use   Smoking status: Never   Smokeless tobacco: Never  Vaping Use   Vaping Use: Never used  Substance and Sexual Activity   Alcohol use: Yes    Alcohol/week: 0.0 standard drinks    Comment: socially   Drug use: No   Sexual activity: Yes    Birth control/protection: I.U.D.

## 2021-10-14 ENCOUNTER — Encounter: Payer: Self-pay | Admitting: Orthopedic Surgery

## 2021-12-02 DIAGNOSIS — G5761 Lesion of plantar nerve, right lower limb: Secondary | ICD-10-CM | POA: Diagnosis not present

## 2021-12-02 DIAGNOSIS — M722 Plantar fascial fibromatosis: Secondary | ICD-10-CM | POA: Diagnosis not present

## 2021-12-16 DIAGNOSIS — M722 Plantar fascial fibromatosis: Secondary | ICD-10-CM | POA: Diagnosis not present

## 2021-12-28 DIAGNOSIS — H04123 Dry eye syndrome of bilateral lacrimal glands: Secondary | ICD-10-CM | POA: Diagnosis not present

## 2021-12-28 DIAGNOSIS — H524 Presbyopia: Secondary | ICD-10-CM | POA: Diagnosis not present

## 2022-01-11 DIAGNOSIS — Z6832 Body mass index (BMI) 32.0-32.9, adult: Secondary | ICD-10-CM | POA: Diagnosis not present

## 2022-01-11 DIAGNOSIS — Z30432 Encounter for removal of intrauterine contraceptive device: Secondary | ICD-10-CM | POA: Diagnosis not present

## 2022-01-11 DIAGNOSIS — Z130 Encounter for screening for diseases of the blood and blood-forming organs and certain disorders involving the immune mechanism: Secondary | ICD-10-CM | POA: Diagnosis not present

## 2022-01-11 DIAGNOSIS — Z1389 Encounter for screening for other disorder: Secondary | ICD-10-CM | POA: Diagnosis not present

## 2022-01-11 DIAGNOSIS — Z01419 Encounter for gynecological examination (general) (routine) without abnormal findings: Secondary | ICD-10-CM | POA: Diagnosis not present

## 2022-02-03 IMAGING — MG MM DIGITAL SCREENING BILAT W/ TOMO AND CAD
8 series · 8 of 24 positions shown · non-contrast
Comparison: Previous exam(s).

CLINICAL DATA: Screening.

EXAM:
DIGITAL SCREENING BILATERAL MAMMOGRAM WITH TOMOSYNTHESIS AND CAD
TECHNIQUE: Bilateral screening digital craniocaudal and mediolateral oblique
mammograms were obtained. Bilateral screening digital breast
tomosynthesis was performed. The images were evaluated with
computer-aided detection.

[R CC synth-2D]
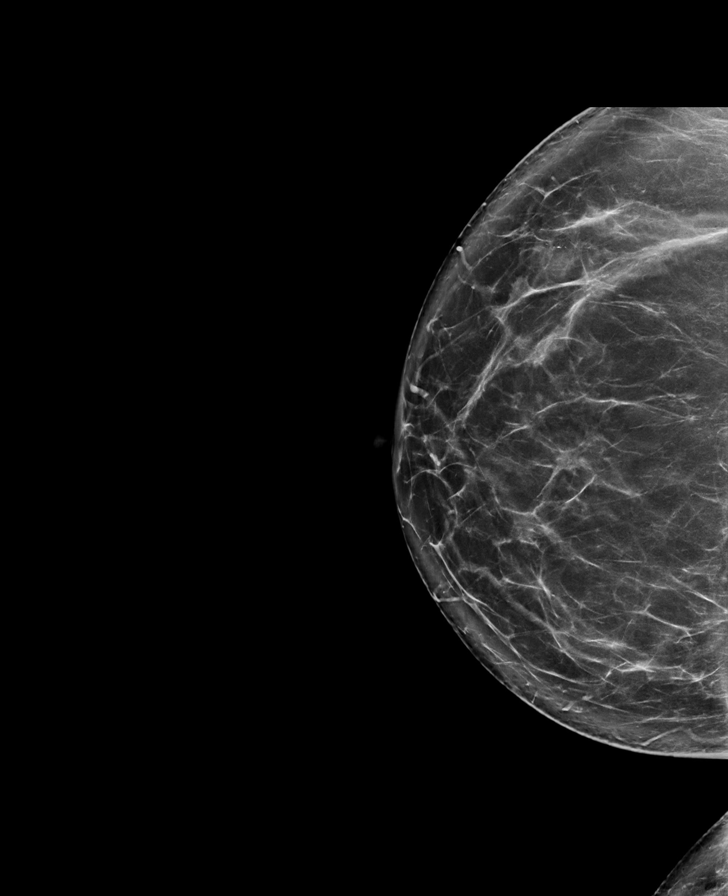

[R MLO synth-2D]
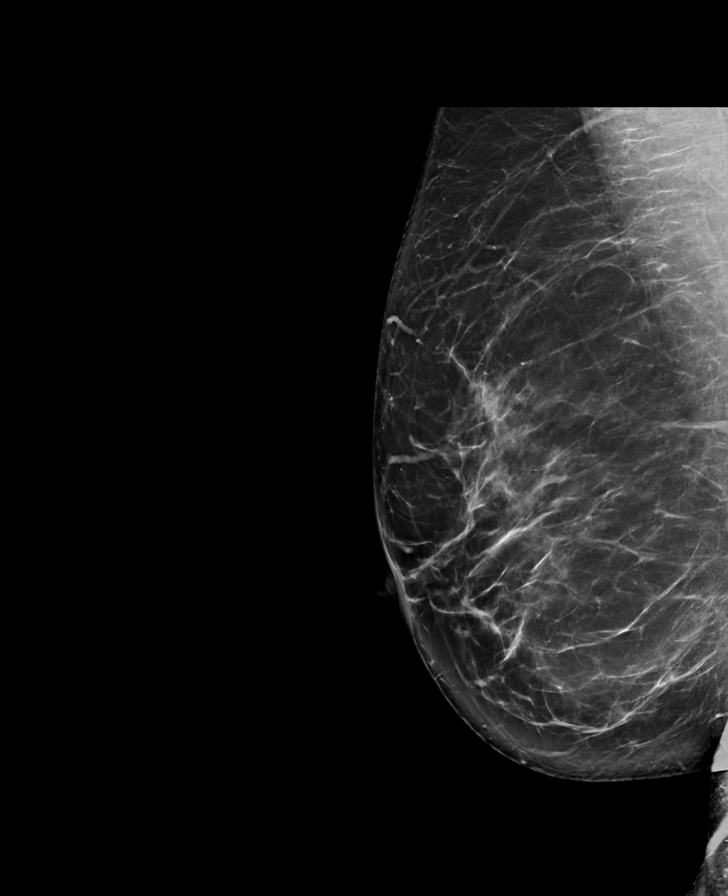

[L MLO synth-2D]
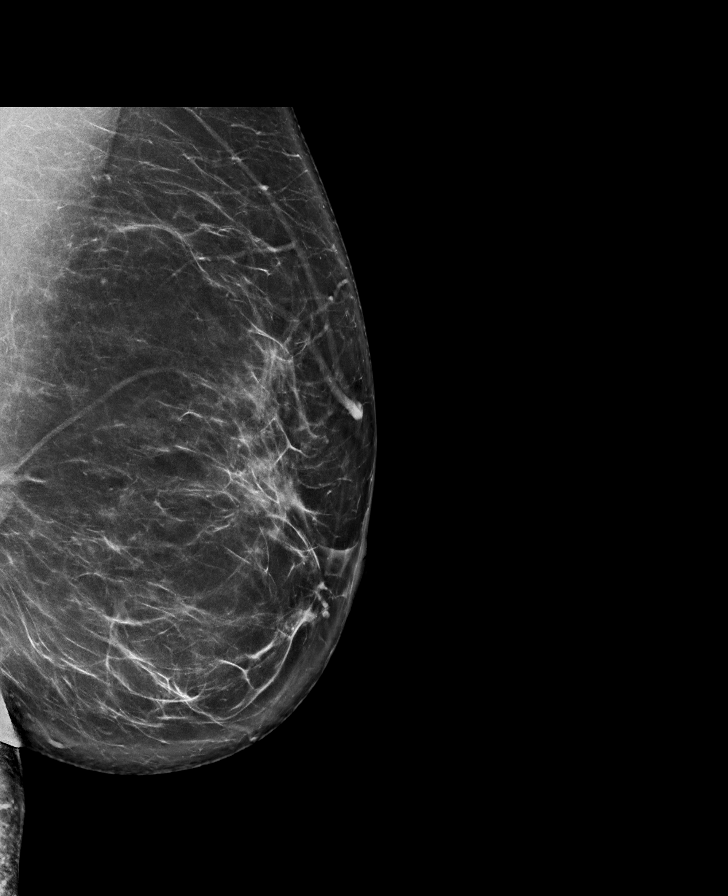

[L CC synth-2D]
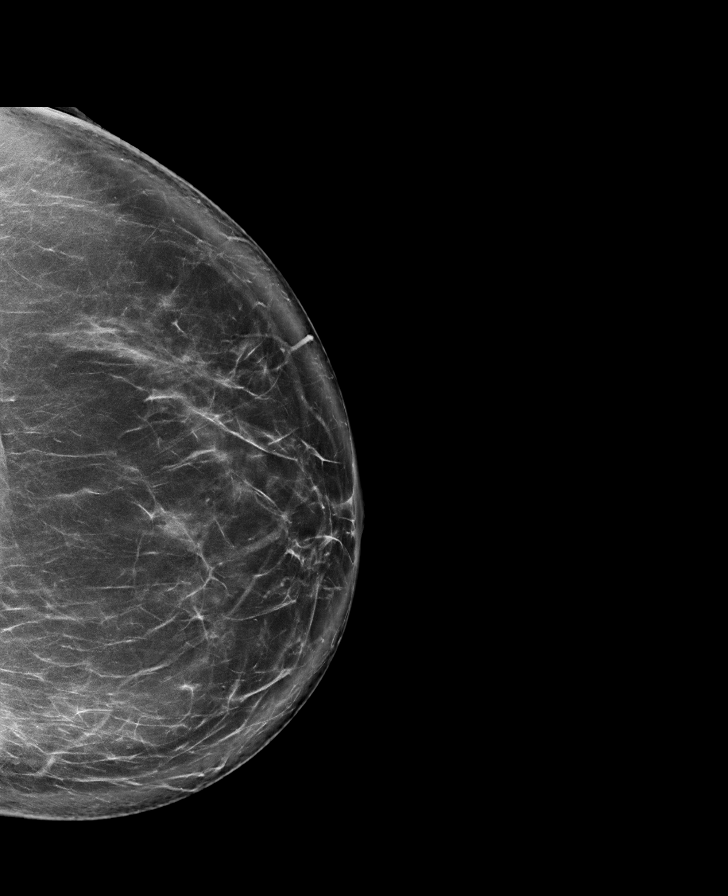

[R MLO tomo · tomo slice 47/93.0]
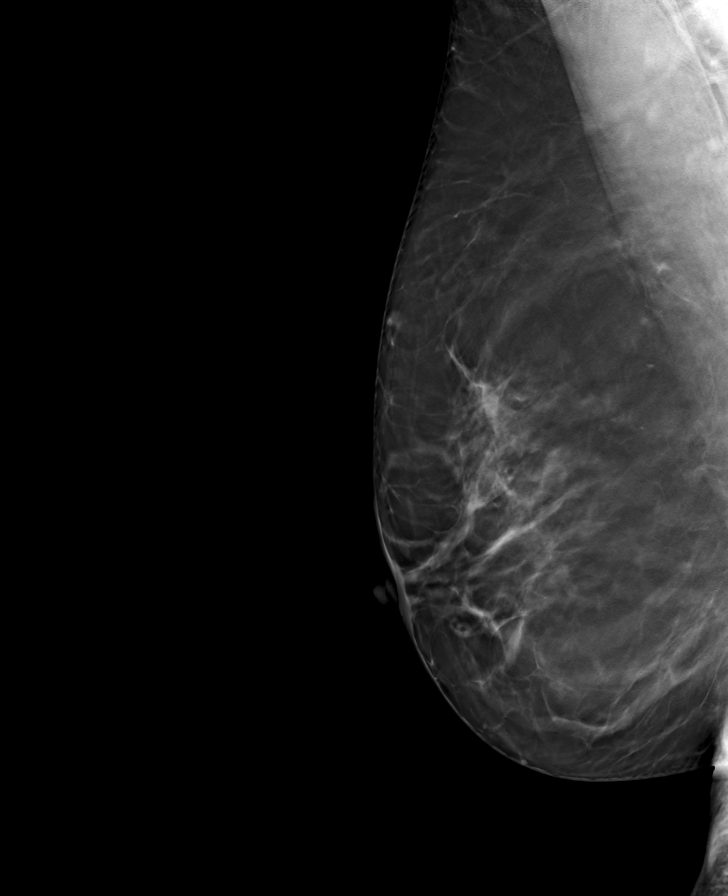

[L MLO tomo · tomo slice 47/94.0]
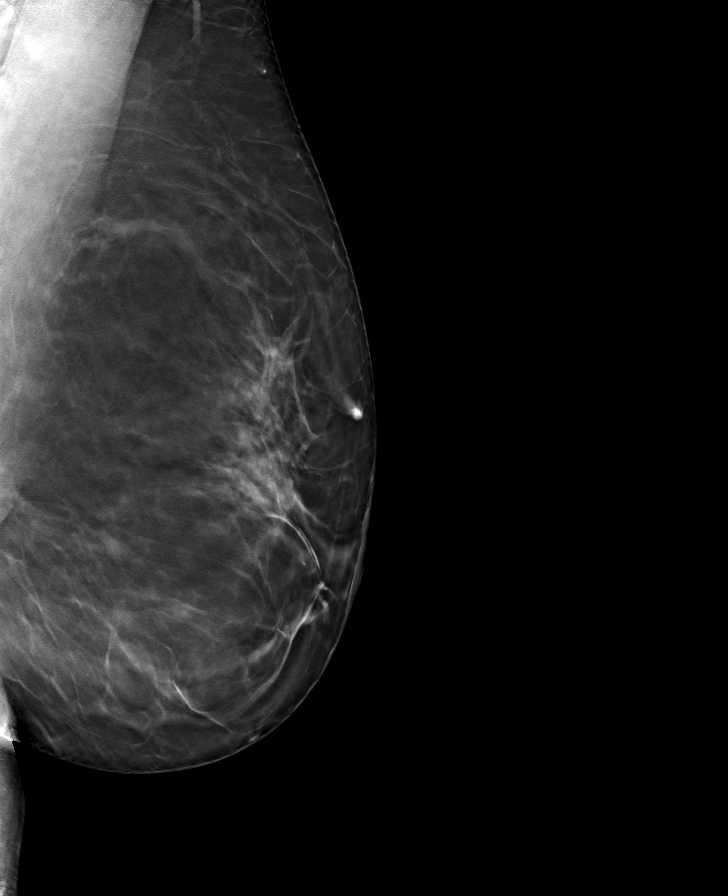

[L CC tomo · tomo slice 51/101.0]
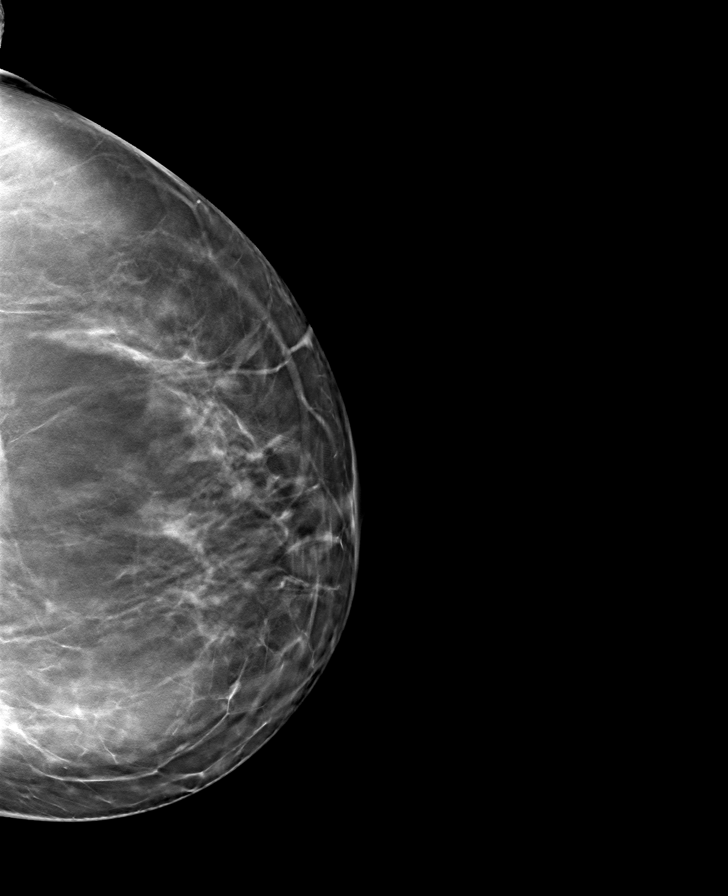

[R CC tomo · tomo slice 47/92.0]
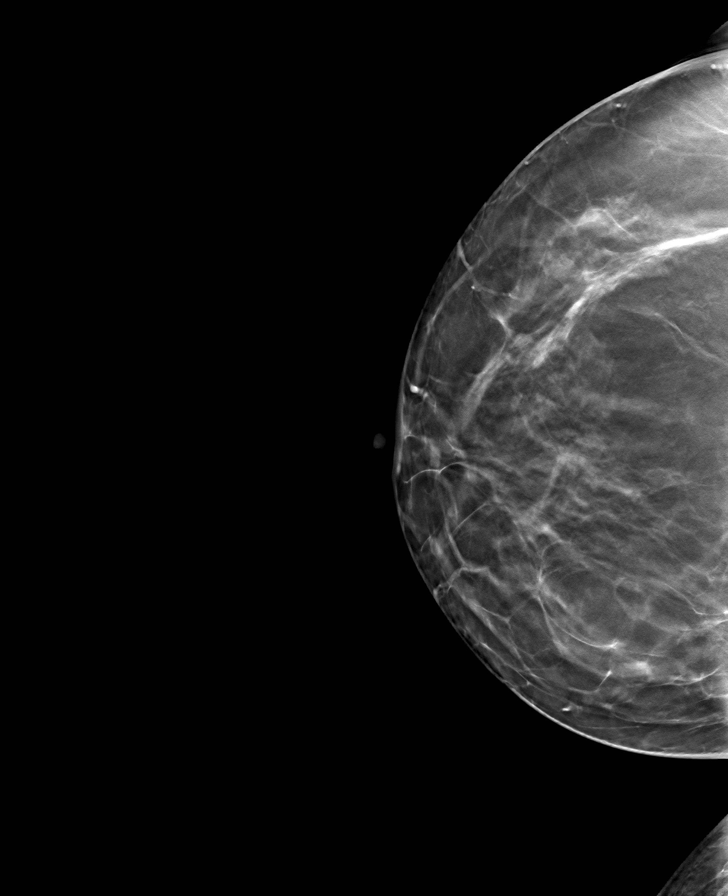

[8 of 24 positions shown; findings below may reference images not displayed]

ACR Breast Density Category b: There are scattered areas of
fibroglandular density.
FINDINGS: There are no findings suspicious for malignancy.
IMPRESSION: No mammographic evidence of malignancy. A result letter of this
screening mammogram will be mailed directly to the patient.

RECOMMENDATION:
Screening mammogram in one year. (Code:51-O-LD2)

BI-RADS CATEGORY  1: Negative.

## 2022-03-02 DIAGNOSIS — D225 Melanocytic nevi of trunk: Secondary | ICD-10-CM | POA: Diagnosis not present

## 2022-03-02 DIAGNOSIS — L578 Other skin changes due to chronic exposure to nonionizing radiation: Secondary | ICD-10-CM | POA: Diagnosis not present

## 2022-03-02 DIAGNOSIS — L814 Other melanin hyperpigmentation: Secondary | ICD-10-CM | POA: Diagnosis not present

## 2022-03-02 DIAGNOSIS — L821 Other seborrheic keratosis: Secondary | ICD-10-CM | POA: Diagnosis not present

## 2022-05-25 ENCOUNTER — Encounter: Payer: Self-pay | Admitting: Physician Assistant

## 2022-06-21 ENCOUNTER — Encounter: Payer: Self-pay | Admitting: Physician Assistant

## 2022-06-21 ENCOUNTER — Ambulatory Visit: Payer: BC Managed Care – PPO | Admitting: Physician Assistant

## 2022-06-21 VITALS — BP 114/70 | HR 64 | Ht 64.25 in | Wt 185.1 lb

## 2022-06-21 DIAGNOSIS — Z1211 Encounter for screening for malignant neoplasm of colon: Secondary | ICD-10-CM | POA: Diagnosis not present

## 2022-06-21 NOTE — Patient Instructions (Addendum)
_______________________________________________________  If you are age 53 or older, your body mass index should be between 23-30. Your Body mass index is 31.53 kg/m. If this is out of the aforementioned range listed, please consider follow up with your Primary Care Provider.  If you are age 44 or younger, your body mass index should be between 19-25. Your Body mass index is 31.53 kg/m. If this is out of the aformentioned range listed, please consider follow up with your Primary Care Provider.   ________________________________________________________  The [place] [facility] providers would like to encourage you to use [facility] to communicate with providers for non-urgent requests or questions.  Due to long hold times on the telephone, sending your provider a message by [facility] may be a faster and more efficient way to get a response.  Please allow 48 business hours for a response.  Please remember that this is for non-urgent requests.  _______________________________________________________   Erica David have been scheduled for a colonoscopy. Please follow written instructions given to you at your visit today.  Please pick up your prep supplies at the pharmacy within the next 1-3 days. If you use inhalers (even only as needed), please bring them with you on the day of your procedure.   Due to recent changes in healthcare laws, you may see the results of your imaging and laboratory studies on MyChart before your provider has had a chance to review them.  We understand that in some cases there may be results that are confusing or concerning to you. Not all laboratory results come back in the same time frame and the provider may be waiting for multiple results in order to interpret others.  Please give [place] 48 hours in order for your provider to thoroughly review all the results before contacting the office for clarification of your results.    Thank you for entrusting me with your care and choosing [facility].  Nicoletta Ba, PA

## 2022-06-21 NOTE — Progress Notes (Signed)
Subjective:    Patient ID: Erica David, female    DOB: [birth year], 51 y.o.   MRN: [id number]  HPI  Erica David is a pleasant 51 year old white female, established with Dr. Rush David, who comes in today to discuss colonoscopy.  She has not had prior colonoscopy. She has no family history of colon cancer or polyps that she is aware of.  She denies any current issues with abdominal pain, changes in bowel habits melena or hematochezia.  She also has history of chronic GERD and Schatzki's ring.  She had presented with a food impaction in 2019, and had undergone EGD, and treatment of Schatzki's ring with biopsy for interruption in September 2019.  Biopsies were negative for EOE, and no Barrett's esophagus. She has been on chronic PPI, omeprazole 40 mg daily which she says that she takes most days but does not always remember. She says the only time she has any issues with dysphagia at this point is if she eats too fast or large pieces of food.  She says she is cognizant of the foods that cause her issues and generally avoids and does not have any problems.  She is not having any regular issues with heartburn or indigestion.  She does not feel that her current symptoms warrant repeat EGD.  Review of Systems Pertinent positive and negative review of systems were noted in the above HPI section.  All other review of systems was otherwise negative.   Outpatient Encounter Medications as of 06/21/2022  Medication Sig   Diclofenac Sodium (PENNSAID) 2 % SOLN Apply 2 g topically 2 (two) times daily as needed (to affected area).   omeprazole (PRILOSEC) 40 MG capsule Take 1 capsule (40 mg total) by mouth daily. (Patient not taking: Reported on 06/21/2022)   [DISCONTINUED] levonorgestrel (MIRENA) 20 MCG/24HR IUD by Intrauterine route.   [DISCONTINUED] Misc Natural Products (OSTEO BI-FLEX ADV TRIPLE ST PO) Take 2 tablets by mouth daily.   No facility-administered encounter medications on file as of 06/21/2022.    No Known Allergies Patient Active Problem List   Diagnosis Date Noted   Schatzki's ring 10/05/2018   Gastroesophageal reflux disease 10/05/2018   Colon cancer screening 10/05/2018   History of esophagogastroduodenoscopy (EGD) 07/22/2018   Dilation of esophagus 07/22/2018   Esophageal dysphagia 07/22/2018   Epicondylitis elbow, medial 12/15/2015   Social History   Socioeconomic History   Marital status: Married    Spouse name: Not on file   Number of children: 3   Years of education: Not on file   Highest education level: Not on file  Occupational History   Occupation: Accountant  Tobacco Use   Smoking status: Never   Smokeless tobacco: Never  Vaping Use   Vaping Use: Never used  Substance and Sexual Activity   Alcohol use: Yes    Comment: socially, 1 per day   Drug use: No   Sexual activity: Yes    Birth control/protection: I.U.D.  Other Topics Concern   Not on file  Social History Narrative   Not on file   Social Determinants of Health   Financial Resource Strain: Not on file  Food Insecurity: Not on file  Transportation Needs: Not on file  Physical Activity: Not on file  Stress: Not on file  Social Connections: Not on file  Intimate Partner Violence: Not on file    Ms. Erica David family history includes Alzheimer's disease in her mother; Breast cancer in her maternal grandmother and sister; Breast cancer (age of onset: 67)  in her sister; Hypertension in her mother.      Objective:    Vitals:   06/21/22 1032  BP: 114/70  Pulse: 64    Physical Exam Well-developed well-nourished WF in no acute distress.  Height, Weight,185  BMI 31.5  HEENT; nontraumatic normocephalic, EOMI, PE R LA, sclera anicteric. Oropharynx;not examined today  Neck; supple, no JVD Cardiovascular; regular rate and rhythm with S1-S2, no murmur rub or gallop Pulmonary; Clear bilaterally Abdomen; soft, nontender, nondistended, no palpable mass or hepatosplenomegaly, bowel sounds are  active Rectal;not done  Skin; benign exam, no jaundice rash or appreciable lesions Extremities; no clubbing cyanosis or edema skin warm and dry Neuro/Psych; alert and oriented x4, grossly nonfocal mood and affect appropriate        Assessment & Plan:   #32 51 year old white female here for colon cancer screening, no prior colonoscopy, average risk, asymptomatic  #2 chronic GERD and history of Schatzki's ring status post dilation 2019 after food impaction. Stable on omeprazole 40 mg daily, patient has not been strict with this regimen. No current significant issues with dysphagia.  Plan; Patient will be scheduled for colonoscopy with Dr. Rush David .  Procedure was discussed in detail with the patient including indications risk and benefits and she is agreeable to proceed.  She will continue omeprazole 40 mg p.o. every morning AC breakfast.  Continue antireflux regimen.  Erica Pollman S Eliodoro Gullett PA-C 06/21/2022   Cc: Girtha Rm, NP-C

## 2022-06-21 NOTE — Progress Notes (Signed)
Attending Physician's Attestation   I have reviewed the chart.   I agree with the Advanced Practitioner's note, impression, and recommendations with any updates as below. Happy to hear that her dysphagia symptoms seem to be better.  If she wants to hold on that being redone that is fine.  Screening colonoscopy is reasonable.   Justice Britain, MD [facility]oscopy Office # [phone]

## 2022-06-22 ENCOUNTER — Encounter: Payer: Self-pay | Admitting: Gastroenterology

## 2022-06-29 ENCOUNTER — Ambulatory Visit (AMBULATORY_SURGERY_CENTER): Payer: BC Managed Care – PPO | Admitting: Gastroenterology

## 2022-06-29 ENCOUNTER — Encounter: Payer: Self-pay | Admitting: Gastroenterology

## 2022-06-29 VITALS — BP 106/69 | HR 71 | Temp 98.4°F | Resp 13 | Ht 64.0 in | Wt 185.0 lb

## 2022-06-29 DIAGNOSIS — D127 Benign neoplasm of rectosigmoid junction: Secondary | ICD-10-CM

## 2022-06-29 DIAGNOSIS — Z1211 Encounter for screening for malignant neoplasm of colon: Secondary | ICD-10-CM

## 2022-06-29 DIAGNOSIS — K635 Polyp of colon: Secondary | ICD-10-CM | POA: Diagnosis not present

## 2022-06-29 MED ORDER — SODIUM CHLORIDE 0.9 % IV SOLN: 500.0000 mL | INTRAVENOUS | Status: DC

## 2022-06-29 NOTE — Progress Notes (Signed)
To pacu, VSS. Report to [email or web address] 

## 2022-06-29 NOTE — Patient Instructions (Signed)
Follow a high fiber diet. Resume previous medications. Awaiting pathology results. Repeat Colonoscopy date to be determined based on pathology results.  Take FiberCon 1-2 tablets over the counter daily by mouth.  YOU HAD AN ENDOSCOPIC PROCEDURE TODAY AT [facility]:   Refer to the procedure report that was given to you for any specific questions about what was found during the examination.  If the procedure report does not answer your questions, please call your gastroenterologist to clarify.  If you requested that your care partner not be given the details of your procedure findings, then the procedure report has been included in a sealed envelope for you to review at your convenience later.  YOU SHOULD EXPECT: Some feelings of bloating in the abdomen. Passage of more gas than usual.  Walking can help get rid of the air that was put into your GI tract during the procedure and reduce the bloating. If you had a lower endoscopy (such as a colonoscopy or flexible sigmoidoscopy) you may notice spotting of blood in your stool or on the toilet paper. If you underwent a bowel prep for your procedure, you may not have a normal bowel movement for a few days.  Please Note:  You might notice some irritation and congestion in your nose or some drainage.  This is from the oxygen used during your procedure.  There is no need for concern and it should clear up in a day or so.  SYMPTOMS TO REPORT IMMEDIATELY:  Following lower endoscopy (colonoscopy or flexible sigmoidoscopy):  Excessive amounts of blood in the stool  Significant tenderness or worsening of abdominal pains  Swelling of the abdomen that is new, acute  Fever of 100F or higher  For urgent or emergent issues, a gastroenterologist can be reached at any hour by calling [phone]. Do not use MyChart messaging for urgent concerns.    DIET:  We do recommend a small meal at first, but then you may proceed to your regular diet.   Drink plenty of fluids but you should avoid alcoholic beverages for 24 hours.  ACTIVITY:  You should plan to take it easy for the rest of today and you should NOT DRIVE or use heavy machinery until tomorrow (because of the sedation medicines used during the test).    FOLLOW UP: Our staff will call the number listed on your records the next business day following your procedure.  We will call around 7:15- 8:00 am to check on you and address any questions or concerns that you may have regarding the information given to you following your procedure. If we do not reach you, we will leave a message.  If you develop any symptoms (ie: fever, flu-like symptoms, shortness of breath, cough etc.) before then, please call [phone].  If you test positive for Covid 19 in the 2 weeks post procedure, please call and report this information to [place].    If any biopsies were taken you will be contacted by phone or by letter within the next 1-3 weeks.  Please call us at [phone] if you have not heard about the biopsies in 3 weeks.    SIGNATURES/CONFIDENTIALITY: You and/or your care partner have signed paperwork which will be entered into your electronic medical record.  These signatures attest to the fact that that the information above on your After Visit Summary has been reviewed and is understood.  Full responsibility of the confidentiality of this discharge information lies with you and/or your care-partner.

## 2022-06-29 NOTE — Progress Notes (Signed)
Called to room to assist during endoscopic procedure.  Patient ID and intended procedure confirmed with present staff. Received instructions for my participation in the procedure from the performing physician.  

## 2022-06-29 NOTE — Progress Notes (Signed)
Pt's states no medical or surgical changes since previsit or office visit. 

## 2022-06-29 NOTE — Op Note (Signed)
[facility] Patient Name: Erica David Procedure Date: 06/29/2022 1:56 PM MRN: [id number] Endoscopist: Justice Britain , MD Age: 51 Referring MD:  Date of Birth: 1971-06-10 Gender: Female Account #: [id number] Procedure:                Colonoscopy Indications:              Screening for colorectal malignant neoplasm Medicines:                Monitored Anesthesia Care Procedure:                Pre-Anesthesia Assessment:                           - Prior to the procedure, a History and Physical                            was performed, and patient medications and                            allergies were reviewed. The patient's tolerance of                            previous anesthesia was also reviewed. The risks                            and benefits of the procedure and the sedation                            options and risks were discussed with the patient.                            All questions were answered, and informed consent                            was obtained. Prior Anticoagulants: The patient has                            taken no previous anticoagulant or antiplatelet                            agents. ASA Grade Assessment: II - A patient with                            mild systemic disease. After reviewing the risks                            and benefits, the patient was deemed in                            satisfactory condition to undergo the procedure.                           After obtaining informed consent, the colonoscope  was passed under direct vision. Throughout the                            procedure, the patient's blood pressure, pulse, and                            oxygen saturations were monitored continuously. The                            CF [id number] #[id number] was introduced through the anus                            and advanced to the the cecum, identified by                            appendiceal  orifice and ileocecal valve. The                            colonoscopy was performed without difficulty. The                            patient tolerated the procedure. The quality of the                            bowel preparation was good. The terminal ileum,                            ileocecal valve, appendiceal orifice, and rectum                            were photographed. Scope In: 2:12:47 PM Scope Out: 2:29:13 PM Scope Withdrawal Time: 0 hours 12 minutes 27 seconds  Total Procedure Duration: 0 hours 16 minutes 26 seconds  Findings:                 The digital rectal exam findings include                            hemorrhoids. Pertinent negatives include no                            palpable rectal lesions.                           The terminal ileum and ileocecal valve appeared                            normal.                           A 3 mm polyp was found in the recto-sigmoid colon.                            The polyp was sessile. The polyp was removed with a  cold snare. Resection and retrieval were complete.                           Normal mucosa was found in the entire colon                            otherwise.                           Non-bleeding non-thrombosed external and internal                            hemorrhoids were found during retroflexion, during                            perianal exam and during digital exam. The                            hemorrhoids were Grade II (internal hemorrhoids                            that prolapse but reduce spontaneously). Complications:            No immediate complications. Estimated Blood Loss:     Estimated blood loss was minimal. Impression:               - Hemorrhoids found on digital rectal exam.                           - The examined portion of the ileum was normal.                           - One 3 mm polyp at the recto-sigmoid colon,                            removed with a  cold snare. Resected and retrieved.                           - Normal mucosa in the entire examined colon                            otherwise.                           - Non-bleeding non-thrombosed external and internal                            hemorrhoids. Recommendation:           - The patient will be observed post-procedure,                            until all discharge criteria are met.                           - Discharge patient to home.                           -  Patient has a contact number available for                            emergencies. The signs and symptoms of potential                            delayed complications were discussed with the                            patient. Return to normal activities tomorrow.                            Written discharge instructions were provided to the                            patient.                           - High fiber diet.                           - Use FiberCon 1-2 tablets PO daily.                           - Continue present medications.                           - Await pathology results.                           - Repeat colonoscopy in 5-10 years for surveillance                            based on pathology results.                           - The findings and recommendations were discussed                            with the patient.                           - The findings and recommendations were discussed                            with the patient's family. Justice Britain, MD 06/29/2022 2:34:35 PM

## 2022-06-29 NOTE — Progress Notes (Signed)
GASTROENTEROLOGY PROCEDURE H&P NOTE   Primary Care Physician: Girtha Rm, NP-C  HPI: Erica David is a 51 y.o. female who presents for Colonoscopy for screening.  Past Medical History:  Diagnosis Date   Epicondylitis elbow, medial    Esophageal stricture    with dilation   Past Surgical History:  Procedure Laterality Date    endoscopy with dilation     UPPER GASTROINTESTINAL ENDOSCOPY     Current Outpatient Medications  Medication Sig Dispense Refill   Diclofenac Sodium (PENNSAID) 2 % SOLN Apply 2 g topically 2 (two) times daily as needed (to affected area). 112 g 3   omeprazole (PRILOSEC) 40 MG capsule Take 1 capsule (40 mg total) by mouth daily. (Patient not taking: Reported on 06/21/2022) 90 capsule 1   No current facility-administered medications for this visit.    Current Outpatient Medications:    Diclofenac Sodium (PENNSAID) 2 % SOLN, Apply 2 g topically 2 (two) times daily as needed (to affected area)., Disp: 112 g, Rfl: 3   omeprazole (PRILOSEC) 40 MG capsule, Take 1 capsule (40 mg total) by mouth daily. (Patient not taking: Reported on 06/21/2022), Disp: 90 capsule, Rfl: 1 No Known Allergies Family History  Problem Relation Age of Onset   Hypertension Mother    Alzheimer's disease Mother    Breast cancer Sister 37   Breast cancer Sister    Breast cancer Maternal Grandmother    Colon cancer Neg Hx    Esophageal cancer Neg Hx    Inflammatory bowel disease Neg Hx    Liver disease Neg Hx    Pancreatic cancer Neg Hx    Stomach cancer Neg Hx    Rectal cancer Neg Hx    Social History   Socioeconomic History   Marital status: Married    Spouse name: Not on file   Number of children: 3   Years of education: Not on file   Highest education level: Not on file  Occupational History   Occupation: Accountant  Tobacco Use   Smoking status: Never   Smokeless tobacco: Never  Vaping Use   Vaping Use: Never used  Substance and Sexual Activity    Alcohol use: Yes    Comment: socially, 1 per day   Drug use: No   Sexual activity: Yes    Birth control/protection: I.U.D.  Other Topics Concern   Not on file  Social History Narrative   Not on file   Social Determinants of Health   Financial Resource Strain: Not on file  Food Insecurity: Not on file  Transportation Needs: Not on file  Physical Activity: Not on file  Stress: Not on file  Social Connections: Not on file  Intimate Partner Violence: Not on file    Physical Exam: There were no vitals filed for this visit. There is no height or weight on file to calculate BMI. GEN: NAD EYE: Sclerae anicteric ENT: MMM CV: Non-tachycardic GI: Soft, NT/ND NEURO:  Alert & Oriented x 3  Lab Results: No results for input(s): "WBC", "HGB", "HCT", "PLT" in the last 72 hours. BMET No results for input(s): "NA", "K", "CL", "CO2", "GLUCOSE", "BUN", "CREATININE", "CALCIUM" in the last 72 hours. LFT No results for input(s): "PROT", "ALBUMIN", "AST", "ALT", "ALKPHOS", "BILITOT", "BILIDIR", "IBILI" in the last 72 hours. PT/INR No results for input(s): "LABPROT", "INR" in the last 72 hours.   Impression / Plan: This is a Erica David who presents for Colonoscopy for screening.  The risks and benefits of endoscopic  evaluation/treatment were discussed with the patient and/or family; these include but are not limited to the risk of perforation, infection, bleeding, missed lesions, lack of diagnosis, severe illness requiring hospitalization, as well as anesthesia and sedation related illnesses.  The patient's history has been reviewed, patient examined, no change in status, and deemed stable for procedure.  The patient and/or family is agreeable to proceed.    Justice Britain, MD [facility] Office # [phone]

## 2022-06-30 ENCOUNTER — Telehealth: Payer: Self-pay | Admitting: *Deleted

## 2022-06-30 NOTE — Telephone Encounter (Signed)
  Follow up Call-     06/29/2022    1:54 PM  Call back number  Post procedure Call Back phone  # [phone]  Permission to leave phone message Yes     Patient questions:  Do you have a fever, pain , or abdominal swelling? No. Pain Score  0 *  Have you tolerated food without any problems? Yes.    Have you been able to return to your normal activities? Yes.    Do you have any questions about your discharge instructions: Diet   No. Medications  No. Follow up visit  No.  Do you have questions or concerns about your Care? No.  Actions: * If pain score is 4 or above: No action needed, pain <4.

## 2022-07-06 ENCOUNTER — Encounter: Payer: Self-pay | Admitting: Gastroenterology

## 2022-08-13 ENCOUNTER — Ambulatory Visit: Payer: BC Managed Care – PPO | Admitting: Medical

## 2022-08-13 VITALS — BP 120/72 | HR 71 | Temp 98.5°F | Wt 187.0 lb

## 2022-08-13 DIAGNOSIS — J019 Acute sinusitis, unspecified: Secondary | ICD-10-CM | POA: Diagnosis not present

## 2022-08-13 DIAGNOSIS — R059 Cough, unspecified: Secondary | ICD-10-CM

## 2022-08-13 MED ORDER — HYDROCODONE BIT-HOMATROP MBR 5-1.5 MG/5ML PO SOLN: 5.0000 mL | Freq: Three times a day (TID) | ORAL | 0 refills | Status: AC | PRN

## 2022-08-13 MED ORDER — AZITHROMYCIN 250 MG PO TABS: ORAL_TABLET | ORAL | 0 refills | Status: DC

## 2022-08-13 MED ORDER — PREDNISONE 10 MG PO TABS: ORAL_TABLET | ORAL | 0 refills | Status: DC

## 2022-08-13 NOTE — Progress Notes (Signed)
Subjective:  Erica David is a 50 y.o. female who presents for Chief Complaint  Patient presents with   Cough    Cough x 4 weeks but got worse this week. Congested head started this week. Husband has been sick all week but not with covid and had to get antibotics and now she feels like she is getting like him and needs something     Here for ongoing respiratory symptoms.  She notes about 3 to 4 weeks of cough and congestion but worse cough in the last week.  Cough actually kept her up last night.  She has some sinus pressure, congestion.  Not much runny nose, no fever.  No body aches or chills.  No ear pain.  No nausea vomiting or diarrhea.  No shortness of breath or wheezing.  Has some sore throat.  Husband started getting sick this week and he has similar symptoms but worse.  He is on 2 antibiotics.  Using some Robitussin.  She rarely gets sick.  Was able to play tennis yesterday, so no shortness of breath.     No history of lung disease.  Nonsmoker.  No prior inhaler.   No other aggravating or relieving factors.    No other c/o.  Past Medical History:  Diagnosis Date   Epicondylitis elbow, medial    Esophageal stricture    with dilation    The following portions of the patient's history were reviewed and updated as appropriate: allergies, current medications, past family history, past medical history, past social history, past surgical history and problem list.  ROS Otherwise as in subjective above  Objective: BP 120/72   Pulse 71   Temp 98.5 F (36.9 C)   Wt 187 lb (84.8 kg)   SpO2 97%   BMI 32.10 kg/m   General appearance: alert, no distress, well developed, well nourished HEENT: normocephalic, sclerae anicteric, conjunctiva pink and moist, TMs flat, nares patent, mild mucoid discharge and + erythema, pharynx normal Oral cavity: MMM, no lesions Neck: supple, no lymphadenopathy, no thyromegaly, no masses Heart: RRR, normal S1, S2, no murmurs Lungs: CTA  bilaterally, no wheezes, rhonchi, or rales Pulses: 2+ radial pulses, 2+ pedal pulses, normal cap refill Ext: no edema   Assessment: Encounter Diagnoses  Name Primary?   Acute non-recurrent sinusitis, unspecified location Yes   Cough, unspecified type      Plan: Discussed symptoms and concerns.  Continue to hydrate well.  Begin Z-Pak antibiotic, Hycodan syrup as needed.  Rest.  Discussed risk and benefits and proper use of medications.  If not seeing improvement within 48 hours can add the prednisone but I doubt she will need this.  We discussed pros and cons of prednisone.  If not much improved within 72 hours call or recheck.  Erica David was seen today for cough.  Diagnoses and all orders for this visit:  Acute non-recurrent sinusitis, unspecified location  Cough, unspecified type  Other orders -     azithromycin (ZITHROMAX) 250 MG tablet; 2 tablets day 1, then 1 tablet days 2-4 -     HYDROcodone bit-homatropine (HYCODAN) 5-1.5 MG/5ML syrup; Take 5 mLs by mouth every 8 (eight) hours as needed for up to 5 days for cough. -     predniSONE (DELTASONE) 10 MG tablet; 6 tablets all together day 1, 5 tablets day 2, 4 tablets day 3, 3 tablets day 4, 2 tablets day 5, 1 tablet day 6.   Follow up: prn

## 2022-08-26 ENCOUNTER — Other Ambulatory Visit: Payer: Self-pay | Admitting: *Deleted

## 2022-08-26 ENCOUNTER — Other Ambulatory Visit: Payer: Self-pay | Admitting: Obstetrics and Gynecology

## 2022-08-26 DIAGNOSIS — Z1231 Encounter for screening mammogram for malignant neoplasm of breast: Secondary | ICD-10-CM

## 2022-09-01 ENCOUNTER — Ambulatory Visit
Admission: RE | Admit: 2022-09-01 | Discharge: 2022-09-01 | Disposition: A | Discharge status: Home / Self Care | Payer: BC Managed Care – PPO | Source: Ambulatory Visit | Attending: Obstetrics and Gynecology | Admitting: Obstetrics and Gynecology

## 2022-09-01 DIAGNOSIS — Z1231 Encounter for screening mammogram for malignant neoplasm of breast: Secondary | ICD-10-CM | POA: Diagnosis not present

## 2022-09-29 ENCOUNTER — Encounter: Payer: Self-pay | Admitting: Internal Medicine

## 2022-10-14 ENCOUNTER — Encounter: Payer: BLUE CROSS/BLUE SHIELD | Admitting: Medical

## 2022-10-21 ENCOUNTER — Encounter: Payer: Self-pay | Admitting: Nurse Practitioner

## 2022-10-21 ENCOUNTER — Ambulatory Visit: Payer: BC Managed Care – PPO | Admitting: Nurse Practitioner

## 2022-10-21 VITALS — BP 118/80 | HR 76 | Temp 98.6°F | Ht 64.25 in | Wt 184.6 lb

## 2022-10-21 DIAGNOSIS — Z0000 Encounter for general adult medical examination without abnormal findings: Secondary | ICD-10-CM | POA: Diagnosis not present

## 2022-10-21 DIAGNOSIS — Z6832 Body mass index (BMI) 32.0-32.9, adult: Secondary | ICD-10-CM | POA: Diagnosis not present

## 2022-10-21 DIAGNOSIS — E785 Hyperlipidemia, unspecified: Secondary | ICD-10-CM | POA: Diagnosis not present

## 2022-10-21 DIAGNOSIS — Z79899 Other long term (current) drug therapy: Secondary | ICD-10-CM | POA: Diagnosis not present

## 2022-10-21 NOTE — Patient Instructions (Signed)
It was a pleasure meeting you today. If you have any concerns please don't hesitate to reach out.  I will be in touch with you about your labs.

## 2022-10-21 NOTE — Progress Notes (Signed)
Worthy Keeler, DNP, AGNP-c [place], [place] [address] Main Office [phone]  BP 118/80   Pulse 76   Temp 98.6 F (37 C)   Ht 5' 4.25" (1.632 m)   Wt 184 lb 9.6 oz (83.7 kg)   BMI 31.44 kg/m    Subjective:    Patient ID: Erica David, female    DOB: [birth year], 51 y.o.   MRN: [id number]  HPI: Erica David is a 51 y.o. female presenting on 10/21/2022 for comprehensive medical examination.   Current medical concerns include: Hip pain  She reports regular vision exams q1-5y: Yes  She reports regular dental exams q 33m  Yes  The patient eats a regular, healthy diet. She endorses exercise and/or activity of:  goes to the gym daily (5 times a week)  She endorses the following: Marital Status: married Living situation: with family Illicit drugs: no history of illicit drug use Domestic violence: Patient was asked about physical abuse by someone important to them: Yes- never  She denies concerns with STI today, testing was not ordered  A comprehensive review of systems was negative.  Most Recent Depression Screen:     10/21/2022    2:19 PM 08/12/2021    1:48 PM 07/07/2020    1:54 PM 04/16/2019    8:51 AM 04/14/2018    8:28 AM  Depression screen PHQ 2/9  Decreased Interest 0 0 0 0 0  Down, Depressed, Hopeless 0 0 0 0 0  PHQ - 2 Score 0 0 0 0 0   Most Recent Anxiety Screen:      No data to display         Most Recent Fall Screen:    10/21/2022    2:19 PM 07/07/2020    1:54 PM 04/16/2019    8:51 AM 04/14/2018    8:28 AM 12/27/2016    8:45 AM  Fall Risk   Falls in the past year? 0 1 0 No No  Number falls in past yr: 0 1 0    Injury with Fall? 0 1 0    Risk for fall due to : No Fall Risks      Follow up Falls evaluation completed        Past medical history, surgical history, medications, allergies, family history and social history reviewed with patient today and changes made to appropriate areas of the  chart.  Past Medical History:  Past Medical History:  Diagnosis Date   Epicondylitis elbow, medial    Esophageal stricture    with dilation   Medications:  Current Outpatient Medications on File Prior to Visit  Medication Sig   Boswellia-Glucosamine-Vit D (OSTEO BI-FLEX ONE PER DAY PO) Take 2 tablets by mouth daily.   No current facility-administered medications on file prior to visit.   Surgical History:  Past Surgical History:  Procedure Laterality Date    endoscopy with dilation     UPPER GASTROINTESTINAL ENDOSCOPY     Allergies:  No Known Allergies Family History:  Family History  Problem Relation Age of Onset   Hypertension Mother    Alzheimer's disease Mother    Breast cancer Sister [phone]  Breast cancer Sister    Breast cancer Maternal Grandmother    Colon cancer Neg Hx    Esophageal cancer Neg Hx    Inflammatory bowel disease Neg Hx    Liver disease Neg Hx    Pancreatic cancer Neg Hx    Stomach cancer Neg Hx  Rectal cancer Neg Hx        Objective:    BP 118/80   Pulse 76   Temp 98.6 F (37 C)   Ht 5' 4.25" (1.632 m)   Wt 184 lb 9.6 oz (83.7 kg)   BMI 31.44 kg/m   Wt Readings from Last 3 Encounters:  10/21/22 184 lb 9.6 oz (83.7 kg)  08/13/22 187 lb (84.8 kg)  06/29/22 185 lb (83.9 kg)    Physical Exam Vitals and nursing note reviewed.  Constitutional:      General: She is not in acute distress.    Appearance: Normal appearance.  HENT:     Head: Normocephalic and atraumatic.     Right Ear: Hearing, tympanic membrane, ear canal and external ear normal.     Left Ear: Hearing, tympanic membrane, ear canal and external ear normal.     Nose: Nose normal.     Right Sinus: No maxillary sinus tenderness or frontal sinus tenderness.     Left Sinus: No maxillary sinus tenderness or frontal sinus tenderness.     Mouth/Throat:     Lips: Pink.     Mouth: Mucous membranes are moist.     Pharynx: Oropharynx is clear.  Eyes:     General: Lids are normal.  Vision grossly intact.     Extraocular Movements: Extraocular movements intact.     Conjunctiva/sclera: Conjunctivae normal.     Pupils: Pupils are equal, round, and reactive to light.     Funduscopic exam:    Right eye: Red reflex present.        Left eye: Red reflex present.    Visual Fields: Right eye visual fields normal and left eye visual fields normal.  Neck:     Thyroid: No thyromegaly.     Vascular: No carotid bruit.  Cardiovascular:     Rate and Rhythm: Normal rate and regular rhythm.     Chest Wall: PMI is not displaced.     Pulses: Normal pulses.          Dorsalis pedis pulses are 2+ on the right side and 2+ on the left side.       Posterior tibial pulses are 2+ on the right side and 2+ on the left side.     Heart sounds: Normal heart sounds. No murmur heard. Pulmonary:     Effort: Pulmonary effort is normal. No respiratory distress.     Breath sounds: Normal breath sounds.  Abdominal:     General: Abdomen is flat. Bowel sounds are normal. There is no distension.     Palpations: Abdomen is soft. There is no hepatomegaly, splenomegaly or mass.     Tenderness: There is no abdominal tenderness. There is no right CVA tenderness, left CVA tenderness, guarding or rebound.  Musculoskeletal:        General: Normal range of motion.     Cervical back: Full passive range of motion without pain, normal range of motion and neck supple. No tenderness.     Right lower leg: No edema.     Left lower leg: No edema.  Feet:     Left foot:     Toenail Condition: Left toenails are normal.  Lymphadenopathy:     Cervical: No cervical adenopathy.     Upper Body:     Right upper body: No supraclavicular adenopathy.     Left upper body: No supraclavicular adenopathy.  Skin:    General: Skin is warm and dry.  Capillary Refill: Capillary refill takes less than 2 seconds.     Nails: There is no clubbing.  Neurological:     General: No focal deficit present.     Mental Status: She is  alert and oriented to person, place, and time.     GCS: GCS eye subscore is 4. GCS verbal subscore is 5. GCS motor subscore is 6.     Sensory: Sensation is intact.     Motor: Motor function is intact.     Coordination: Coordination is intact.     Gait: Gait is intact.     Deep Tendon Reflexes: Reflexes are normal and symmetric.  Psychiatric:        Attention and Perception: Attention normal.        Mood and Affect: Mood normal.        Speech: Speech normal.        Behavior: Behavior normal. Behavior is cooperative.        Thought Content: Thought content normal.        Cognition and Memory: Cognition and memory normal.        Judgment: Judgment normal.     Results for orders placed or performed in visit on 10/21/22  CBC with Differential/Platelet  Result Value Ref Range   WBC 4.9 3.4 - 10.8 x10E3/uL   RBC 4.80 3.77 - 5.28 x10E6/uL   Hemoglobin 14.1 11.1 - 15.9 g/dL   Hematocrit 43.5 34.0 - 46.6 %   MCV 91 79 - 97 fL   MCH 29.4 26.6 - 33.0 pg   MCHC 32.4 31.5 - 35.7 g/dL   RDW 12.7 11.7 - 15.4 %   Platelets 260 150 - 450 x10E3/uL   Neutrophils 58 Not Estab. %   Lymphs 30 Not Estab. %   Monocytes 7 Not Estab. %   Eos 4 Not Estab. %   Basos 1 Not Estab. %   Neutrophils Absolute 2.8 1.4 - 7.0 x10E3/uL   Lymphocytes Absolute 1.5 0.7 - 3.1 x10E3/uL   Monocytes Absolute 0.3 0.1 - 0.9 x10E3/uL   EOS (ABSOLUTE) 0.2 0.0 - 0.4 x10E3/uL   Basophils Absolute 0.0 0.0 - 0.2 x10E3/uL   Immature Granulocytes 0 Not Estab. %   Immature Grans (Abs) 0.0 0.0 - 0.1 x10E3/uL  Comprehensive metabolic panel  Result Value Ref Range   Glucose 84 70 - 99 mg/dL   BUN 15 6 - 24 mg/dL   Creatinine, Ser 0.98 0.57 - 1.00 mg/dL   eGFR 69 >59 mL/min/1.73   BUN/Creatinine Ratio 15 9 - 23   Sodium 141 134 - 144 mmol/L   Potassium 4.6 3.5 - 5.2 mmol/L   Chloride 102 96 - 106 mmol/L   CO2 25 20 - 29 mmol/L   Calcium 9.5 8.7 - 10.2 mg/dL   Total Protein 6.8 6.0 - 8.5 g/dL   Albumin 4.4 3.8 - 4.9 g/dL    Globulin, Total 2.4 1.5 - 4.5 g/dL   Albumin/Globulin Ratio 1.8 1.2 - 2.2   Bilirubin Total 0.4 0.0 - 1.2 mg/dL   Alkaline Phosphatase 72 44 - 121 IU/L   AST 21 0 - 40 IU/L   ALT 6 0 - 32 IU/L  Lipid panel  Result Value Ref Range   Cholesterol, Total 207 (H) 100 - 199 mg/dL   Triglycerides 85 0 - 149 mg/dL   HDL 62 >39 mg/dL   VLDL Cholesterol Cal 15 5 - 40 mg/dL   LDL Chol Calc (NIH) 130 (H) 0 - 99 mg/dL  Chol/HDL Ratio 3.3 0.0 - 4.4 ratio  TSH  Result Value Ref Range   TSH 3.370 0.450 - 4.500 uIU/mL  VITAMIN D 25 Hydroxy (Vit-D Deficiency, Fractures)  Result Value Ref Range   Vit D, 25-Hydroxy 34.8 30.0 - 100.0 ng/mL    IMMUNIZATIONS:   Flu: Flu vaccine completed elsewhere this season Prevnar 13: Prevnar 13 N/A for this patient Pneumovax 23: Pneumovax 23 N/A for this patient Vac Shingrix: Shingrix N/A for this patient HPV: HPV due, prescription provided Tetanus: Tetanus completed in the last 10 years  HEALTH MAINTENANCE: Pap Smear HM Status: is up to date Mammogram HM Status: is up to date Colon Cancer Screening HM Status: is up to date Bone Density HM Status: is not applicable for this patient STI Testing HM Status: is not applicable for this patient  Eye Exam HM Status: is up to date Urine Micro HM Status: is not applicable for this patient  Spirometry HM Status: is not applicable for this patient      Assessment & Plan:   Problem List Items Addressed This Visit     Health maintenance examination    CPE today with no abnormalities noted on exam.  Labs pending. Will make changes as necessary based on results.  Review of HM activities and recommendations discussed and provided on AVS Anticipatory guidance, diet, and exercise recommendations provided.  Medications, allergies, and hx reviewed and updated as necessary.  Plan to f/u with CPE in 1 year or sooner for acute/chronic health needs as directed.        Relevant Orders   CBC with Differential/Platelet  (Completed)   Comprehensive metabolic panel (Completed)   Lipid panel (Completed)   TSH (Completed)   VITAMIN D 25 Hydroxy (Vit-D Deficiency, Fractures) (Completed)   Other Visit Diagnoses     Routine general medical examination at a health care facility    -  Primary          Follow up plan: Return in about 1 year (around 10/22/2023) for CPE.  NEXT PREVENTATIVE PHYSICAL DUE IN 1 YEAR.  PATIENT COUNSELING PROVIDED FOR ALL ADULT PATIENTS:  Consume a well balanced diet low in saturated fats, cholesterol, and moderation in carbohydrates.   This can be as simple as monitoring portion sizes and cutting back on sugary beverages such as soda and juice to start with.    Daily water consumption of at least 64 ounces.  Physical activity at least 180 minutes per week, if just starting out.   This can be as simple as taking the stairs instead of the elevator and walking 2-3 laps around the office  purposefully every day.   STD protection, partner selection, and regular testing if high risk.  Limited consumption of alcoholic beverages if alcohol is consumed.  For women, I recommend no more than 7 alcoholic beverages per week, spread out throughout the week.  Avoid "binge" drinking or consuming large quantities of alcohol in one setting.   Please let me know if you feel you may need help with reduction or quitting alcohol consumption.   Avoidance of nicotine, if used.  Please let me know if you feel you may need help with reduction or quitting nicotine use.   Daily mental health attention.  This can be in the form of 5 minute daily meditation, prayer, journaling, yoga, reflection, etc.   Purposeful attention to your emotions and mental state can significantly improve your overall wellbeing and Health.  Please know that I am here to help  you with all of your health care goals and am happy to work with you to find a solution that works best for you.  The greatest advice I have received  with any changes in life are to take it one step at a time, that even means if all you can focus on is the next 60 seconds, then do that and celebrate your victories.  With any changes in life, you will have set backs, and that is OK. The important thing to remember is, if you have a set back, it is not a failure, it is an opportunity to try again!  Health Maintenance Recommendations Screening Testing Mammogram Every 1 -2 years based on history and risk factors Starting at age 23 Pap Smear Ages 21-39 every 3 years Ages 63-65 every 5 years with HPV testing More frequent testing may be required based on results and history Colon Cancer Screening Every 1-10 years based on test performed, risk factors, and history Starting at age 4 Bone Density Screening Every 2-10 years based on history Starting at age 53 for women Recommendations for men differ based on medication usage, history, and risk factors AAA Screening One time ultrasound Men 54-109 years old who have every smoked Lung Cancer Screening Low Dose Lung CT every 12 months Age 55-80 years with a 30 pack-year smoking history who still smoke or who have quit within the last 15 years  Screening Labs Routine  Labs: Complete Blood Count (CBC), Complete Metabolic Panel (CMP), Cholesterol (Lipid Panel) Every 6-12 months based on history and medications May be recommended more frequently based on current conditions or previous results Hemoglobin A1c Lab Every 3-12 months based on history and previous results Starting at age 53 or earlier with diagnosis of diabetes, high cholesterol, BMI >26, and/or risk factors Frequent monitoring for patients with diabetes to ensure blood sugar control Thyroid Panel (TSH w/ T3 & T4) Every 6 months based on history, symptoms, and risk factors May be repeated more often if on medication HIV One time testing for all patients 43 and older May be repeated more frequently for patients with increased risk  factors or exposure Hepatitis C One time testing for all patients 70 and older May be repeated more frequently for patients with increased risk factors or exposure Gonorrhea, Chlamydia Every 12 months for all sexually active persons 13-24 years Additional monitoring may be recommended for those who are considered high risk or who have symptoms PSA Men 32-24 years old with risk factors Additional screening may be recommended from age 58-69 based on risk factors, symptoms, and history  Vaccine Recommendations Tetanus Booster All adults every 10 years Flu Vaccine All patients 6 months and older every year COVID Vaccine All patients 12 years and older Initial dosing with booster May recommend additional booster based on age and health history HPV Vaccine 2 doses all patients age 4-26 Dosing may be considered for patients over 26 Shingles Vaccine (Shingrix) 2 doses all adults 31 years and older Pneumonia (Pneumovax 23) All adults 6 years and older May recommend earlier dosing based on health history Pneumonia (Prevnar 45) All adults 38 years and older Dosed 1 year after Pneumovax 23  Additional Screening, Testing, and Vaccinations may be recommended on an individualized basis based on family history, health history, risk factors, and/or exposure.

## 2022-10-22 LAB — CBC WITH DIFFERENTIAL/PLATELET
Basophils Absolute: 0 10*3/uL (ref 0.0–0.2)
Basos: 1 %
EOS (ABSOLUTE): 0.2 10*3/uL (ref 0.0–0.4)
Eos: 4 %
Hematocrit: 43.5 % (ref 34.0–46.6)
Hemoglobin: 14.1 g/dL (ref 11.1–15.9)
Immature Grans (Abs): 0 10*3/uL (ref 0.0–0.1)
Immature Granulocytes: 0 %
Lymphocytes Absolute: 1.5 10*3/uL (ref 0.7–3.1)
Lymphs: 30 %
MCH: 29.4 pg (ref 26.6–33.0)
MCHC: 32.4 g/dL (ref 31.5–35.7)
MCV: 91 fL (ref 79–97)
Monocytes Absolute: 0.3 10*3/uL (ref 0.1–0.9)
Monocytes: 7 %
Neutrophils Absolute: 2.8 10*3/uL (ref 1.4–7.0)
Neutrophils: 58 %
Platelets: 260 10*3/uL (ref 150–450)
RBC: 4.8 x10E6/uL (ref 3.77–5.28)
RDW: 12.7 % (ref 11.7–15.4)
WBC: 4.9 10*3/uL (ref 3.4–10.8)

## 2022-10-22 LAB — COMPREHENSIVE METABOLIC PANEL
ALT: 6 IU/L (ref 0–32)
AST: 21 IU/L (ref 0–40)
Albumin/Globulin Ratio: 1.8 (ref 1.2–2.2)
Albumin: 4.4 g/dL (ref 3.8–4.9)
Alkaline Phosphatase: 72 IU/L (ref 44–121)
BUN/Creatinine Ratio: 15 (ref 9–23)
BUN: 15 mg/dL (ref 6–24)
Bilirubin Total: 0.4 mg/dL (ref 0.0–1.2)
CO2: 25 mmol/L (ref 20–29)
Calcium: 9.5 mg/dL (ref 8.7–10.2)
Chloride: 102 mmol/L (ref 96–106)
Creatinine, Ser: 0.98 mg/dL (ref 0.57–1.00)
Globulin, Total: 2.4 g/dL (ref 1.5–4.5)
Glucose: 84 mg/dL (ref 70–99)
Potassium: 4.6 mmol/L (ref 3.5–5.2)
Sodium: 141 mmol/L (ref 134–144)
Total Protein: 6.8 g/dL (ref 6.0–8.5)
eGFR: 69 mL/min/{1.73_m2} (ref >59)

## 2022-10-22 LAB — LIPID PANEL
Chol/HDL Ratio: 3.3 ratio (ref 0.0–4.4)
Cholesterol, Total: 207 mg/dL — ABNORMAL HIGH (ref 100–199)
HDL: 62 mg/dL (ref >39)
LDL Chol Calc (NIH): 130 mg/dL — ABNORMAL HIGH (ref 0–99)
Triglycerides: 85 mg/dL (ref 0–149)
VLDL Cholesterol Cal: 15 mg/dL (ref 5–40)

## 2022-10-22 LAB — VITAMIN D 25 HYDROXY (VIT D DEFICIENCY, FRACTURES): Vit D, 25-Hydroxy: 34.8 ng/mL (ref 30.0–100.0)

## 2022-10-22 LAB — TSH: TSH: 3.37 u[IU]/mL (ref 0.450–4.500)

## 2022-11-09 NOTE — Assessment & Plan Note (Signed)

## 2023-01-07 DIAGNOSIS — H04123 Dry eye syndrome of bilateral lacrimal glands: Secondary | ICD-10-CM | POA: Diagnosis not present

## 2023-01-07 DIAGNOSIS — H524 Presbyopia: Secondary | ICD-10-CM | POA: Diagnosis not present

## 2023-03-01 DIAGNOSIS — Z1389 Encounter for screening for other disorder: Secondary | ICD-10-CM | POA: Diagnosis not present

## 2023-03-01 DIAGNOSIS — E559 Vitamin D deficiency, unspecified: Secondary | ICD-10-CM | POA: Diagnosis not present

## 2023-03-01 DIAGNOSIS — Z01419 Encounter for gynecological examination (general) (routine) without abnormal findings: Secondary | ICD-10-CM | POA: Diagnosis not present

## 2023-10-24 ENCOUNTER — Encounter: Payer: Self-pay | Admitting: Nurse Practitioner

## 2023-10-24 ENCOUNTER — Telehealth: Payer: Self-pay

## 2023-10-24 ENCOUNTER — Ambulatory Visit (INDEPENDENT_AMBULATORY_CARE_PROVIDER_SITE_OTHER): Payer: 59 | Admitting: Nurse Practitioner

## 2023-10-24 VITALS — BP 120/76 | HR 58 | Wt 181.8 lb

## 2023-10-24 DIAGNOSIS — E7800 Pure hypercholesterolemia, unspecified: Secondary | ICD-10-CM

## 2023-10-24 DIAGNOSIS — Z23 Encounter for immunization: Secondary | ICD-10-CM

## 2023-10-24 DIAGNOSIS — M25559 Pain in unspecified hip: Secondary | ICD-10-CM | POA: Diagnosis not present

## 2023-10-24 DIAGNOSIS — E559 Vitamin D deficiency, unspecified: Secondary | ICD-10-CM

## 2023-10-24 DIAGNOSIS — M25569 Pain in unspecified knee: Secondary | ICD-10-CM

## 2023-10-24 DIAGNOSIS — Z0000 Encounter for general adult medical examination without abnormal findings: Secondary | ICD-10-CM

## 2023-10-24 DIAGNOSIS — G8929 Other chronic pain: Secondary | ICD-10-CM | POA: Diagnosis not present

## 2023-10-24 NOTE — Patient Instructions (Signed)
Everything looks great today.   I will be in touch with you with your lab results. Next year we can plan to do your labs Erica David and review these during your visit if you would like.  If your hips get worse please let me know.  Joint Pain  Joint pain can be caused by many things. It may go away if you follow instructions from your health care provider for taking care of yourself at home. Sometimes, you may need more treatment. Joint pain can be caused by: Bruises at the area of the joint. An injury caused by movements that are repeated. Wear and tear on the joint as you get older. Buildup of uric acid crystals in the joint. This is also called gout. Irritation and swelling of the joint. Types of arthritis. Infections of the joint or of the bone. Your provider may tell you to take pain medicine or wear an elastic bandage, sling, or splint. If your joint pain continues, you may need lab or imaging tests to find the cause of your joint pain. Follow these instructions at home: If you have an elastic bandage, sling, or splint that can be taken off: Wear the bandage, sling, or splint as told by your provider. Take it off only if your provider says you can. Check the skin under and around it every day. Tell your provider if you see problems. Loosen it if your fingers or toes tingle, are numb, or turn cold and blue. Keep it clean and dry. Ask your provider if you should remove it before bathing. If the bandage, sling, or splint is not waterproof: Do not let it get wet. Cover it when you take a bath or shower. Use a cover that does not let any water in. Managing pain, stiffness, and swelling     If told, put ice on the area. If you have an elastic bandage, sling, or splint that you can take off, remove it as told. Put ice in a plastic bag. Place a towel between your skin and the bag. Leave the ice on for 20 minutes, 2-3 times a day. If told, put heat on the area. Do this as often as told.  Use the heat source that your provider recommends, such as a moist heat pack or a heating pad. Place a towel between your skin and the heat source. Leave the heat on for 20-30 minutes. If your skin turns bright red, take off the ice or heat right away to prevent skin damage. The risk of damage is higher if you can't feel pain, heat, or cold. Move your fingers or toes often to reduce stiffness and swelling. Raise the injured area above the level of your heart while you're sitting or lying down. Use a pillow to support the painful area as needed. Activity Rest the painful joint as told. Do not do things that cause pain or make pain worse. Begin exercising or stretching the affected area as told by your provider. Return to normal activities when you are told. Ask what things are safe for you to do. General instructions Take your medicines as told by your provider. Treatment may include medicines for pain and swelling that are taken by mouth or applied to the skin. Do not smoke, vape, or use products with nicotine or tobacco in them. If you need help quitting, talk with your provider. Keep all follow-up visits. Your provider will want to check on your condition. Contact a health care provider if: You have pain  that does not get better with medicine. Your joint pain does not improve within 3 days. You have more bruising or swelling. You have a fever. You lose 10 lb (4.5 kg) or more without trying. Get help right away if: You cannot move the joint. Your fingers or toes tingle, become numb, or turn cold and blue. You have a fever along with a joint that's red, warm, and swollen. This information is not intended to replace advice given to you by your health care provider. Make sure you discuss any questions you have with your health care provider. Document Revised: 01/28/2023 Document Reviewed: 01/14/2023 Elsevier Patient Education  2024 [employer].

## 2023-10-24 NOTE — Telephone Encounter (Signed)
Pt has next CPE scheduled for 10/25/24 but has labs on 10/18/23 (ok per SB). Will you put in the orders?

## 2023-10-26 ENCOUNTER — Other Ambulatory Visit: Payer: Self-pay

## 2023-10-26 DIAGNOSIS — E559 Vitamin D deficiency, unspecified: Secondary | ICD-10-CM

## 2023-10-26 DIAGNOSIS — Z0000 Encounter for general adult medical examination without abnormal findings: Secondary | ICD-10-CM | POA: Diagnosis not present

## 2023-10-26 DIAGNOSIS — E7800 Pure hypercholesterolemia, unspecified: Secondary | ICD-10-CM | POA: Diagnosis not present

## 2023-10-26 LAB — LIPID PANEL

## 2023-10-27 ENCOUNTER — Other Ambulatory Visit: Payer: Self-pay

## 2023-10-27 DIAGNOSIS — Z0000 Encounter for general adult medical examination without abnormal findings: Secondary | ICD-10-CM

## 2023-10-27 DIAGNOSIS — G8929 Other chronic pain: Secondary | ICD-10-CM | POA: Insufficient documentation

## 2023-10-27 DIAGNOSIS — E7800 Pure hypercholesterolemia, unspecified: Secondary | ICD-10-CM | POA: Insufficient documentation

## 2023-10-27 DIAGNOSIS — M25559 Pain in unspecified hip: Secondary | ICD-10-CM | POA: Insufficient documentation

## 2023-10-27 DIAGNOSIS — E559 Vitamin D deficiency, unspecified: Secondary | ICD-10-CM

## 2023-10-27 LAB — LIPID PANEL
Cholesterol, Total: 186 mg/dL (ref 100–199)
HDL: 54 mg/dL (ref >39)
LDL CALC COMMENT:: 3.4 ratio (ref 0.0–4.4)
LDL Chol Calc (NIH): 117 mg/dL — ABNORMAL HIGH (ref 0–99)
Triglycerides: 79 mg/dL (ref 0–149)
VLDL Cholesterol Cal: 15 mg/dL (ref 5–40)

## 2023-10-27 LAB — CMP14+EGFR
ALT: 6 IU/L (ref 0–32)
AST: 22 IU/L (ref 0–40)
Albumin: 4 g/dL (ref 3.8–4.9)
Alkaline Phosphatase: 75 IU/L (ref 44–121)
BUN/Creatinine Ratio: 15 (ref 9–23)
BUN: 13 mg/dL (ref 6–24)
Bilirubin Total: 0.3 mg/dL (ref 0.0–1.2)
CO2: 25 mmol/L (ref 20–29)
Calcium: 8.8 mg/dL (ref 8.7–10.2)
Chloride: 105 mmol/L (ref 96–106)
Creatinine, Ser: 0.84 mg/dL (ref 0.57–1.00)
Globulin, Total: 2.4 g/dL (ref 1.5–4.5)
Glucose: 92 mg/dL (ref 70–99)
Potassium: 4.6 mmol/L (ref 3.5–5.2)
Sodium: 141 mmol/L (ref 134–144)
Total Protein: 6.4 g/dL (ref 6.0–8.5)
eGFR: 83 mL/min/{1.73_m2} (ref >59)

## 2023-10-27 LAB — CBC WITH DIFFERENTIAL/PLATELET
Basophils Absolute: 0 10*3/uL (ref 0.0–0.2)
Basos: 1 %
EOS (ABSOLUTE): 0.2 10*3/uL (ref 0.0–0.4)
Eos: 5 %
Hematocrit: 41.3 % (ref 34.0–46.6)
Hemoglobin: 13.3 g/dL (ref 11.1–15.9)
Immature Grans (Abs): 0 10*3/uL (ref 0.0–0.1)
Immature Granulocytes: 0 %
Lymphocytes Absolute: 1.2 10*3/uL (ref 0.7–3.1)
Lymphs: 29 %
MCH: 30.6 pg (ref 26.6–33.0)
MCHC: 32.2 g/dL (ref 31.5–35.7)
MCV: 95 fL (ref 79–97)
Monocytes Absolute: 0.4 10*3/uL (ref 0.1–0.9)
Monocytes: 8 %
Neutrophils Absolute: 2.5 10*3/uL (ref 1.4–7.0)
Neutrophils: 57 %
Platelets: 208 10*3/uL (ref 150–450)
RBC: 4.35 x10E6/uL (ref 3.77–5.28)
RDW: 13.2 % (ref 11.7–15.4)
WBC: 4.3 10*3/uL (ref 3.4–10.8)

## 2023-10-27 LAB — VITAMIN D 25 HYDROXY (VIT D DEFICIENCY, FRACTURES): Vit D, 25-Hydroxy: 39.7 ng/mL (ref 30.0–100.0)

## 2023-10-27 NOTE — Assessment & Plan Note (Signed)
Chronic knee pain, likely related to tennis activity. No significant changes or acute exacerbations reported. Advised to continue current activity level and monitor for any worsening symptoms. - Continue current activity level - Monitor for any worsening symptom

## 2023-10-27 NOTE — Progress Notes (Signed)
Shawna Clamp, DNP, AGNP-c [facility] [address] [place], [place] [address] Main Office [phone]  BP 120/76   Pulse (!) 58   Wt 181 lb 12.8 oz (82.5 kg)   BMI 30.96 kg/m    Subjective:    Patient ID: Erica David, female    DOB: 09/13/1971, 52 y.o.   MRN: [id number]  HPI: Erica David is a 52 y.o. female presenting on 10/24/2023 for comprehensive medical examination.   The patient, a 52 year old with no significant past medical history, presents with discomfort in the hip region, particularly when sleeping on her side. The discomfort is bilateral and is not associated with any soreness or pain. The patient suspects the discomfort may be related to her current bed and has ordered a new one. She has been sleeping in a different bed for the past two weeks, which has led to some improvement in the discomfort.  The patient maintains an active lifestyle, including daily exercise and playing tennis. She is currently taking Osteobioflex for joint health and a Vitamin D supplement due to a previous low level in her blood work. She has no other complaints or concerns at this time.  The patient has no history of thyroid disease and denies any vision issues, fullness in the throat, difficulty swallowing, chest pain, shortness of breath, dizziness, changes in bowel or bladder habits, or vaginal bleeding. She also denies any swelling in her feet or ankles. The patient is up-to-date with her flu shot.  Pertinent items are noted in HPI.  IMMUNIZATIONS:   Flu Vaccine: Flu vaccine completed elsewhere this season. Record updated. Prevnar 13: Prevnar 13 N/A for this patient Prevnar 20: Prevnar 20 N/A for this patient Pneumovax 23: Pneumovax 23 N/A for this patient Vac Shingrix: Shingrix declined, patient will complete at a later date HPV: N/A or Aged Out Tetanus: Tetanus completed in the last 10 years COVID: COVID completed, documentation in chart  RSV:  No  HEALTH MAINTENANCE: Pap Smear HM Status: is due and will be scheduled by patient in the near future Mammogram HM Status: is up to date Colon Cancer Screening HM Status: is up to date Bone Density HM Status: N/A STI Testing HM Status: was declined  Lung CT HM Status: N/A  Concerns with vision, hearing, or dentition: No  Most Recent Depression Screen:     10/24/2023    2:04 PM 10/21/2022    2:19 PM 08/12/2021    1:48 PM 07/07/2020    1:54 PM 04/16/2019    8:51 AM  Depression screen PHQ 2/9  Decreased Interest 0 0 0 0 0  Down, Depressed, Hopeless 0 0 0 0 0  PHQ - 2 Score 0 0 0 0 0   Most Recent Anxiety Screen:      No data to display         Most Recent Fall Screen:    10/24/2023    2:04 PM 10/21/2022    2:19 PM 07/07/2020    1:54 PM 04/16/2019    8:51 AM 04/14/2018    8:28 AM  Fall Risk   Falls in the past year? 0 0 1 0 No  Number falls in past yr: 0 0 1 0   Injury with Fall? 0 0 1 0   Risk for fall due to : No Fall Risks No Fall Risks     Follow up Falls evaluation completed Falls evaluation completed       Past medical history, surgical history, medications, allergies, family  history and social history reviewed with patient today and changes made to appropriate areas of the chart.  Past Medical History:  Past Medical History:  Diagnosis Date   Epicondylitis elbow, medial    Esophageal stricture    with dilation   Medications:  Current Outpatient Medications on File Prior to Visit  Medication Sig   Boswellia-Glucosamine-Vit D (OSTEO BI-FLEX ONE PER DAY PO) Take 2 tablets by mouth daily.   No current facility-administered medications on file prior to visit.   Surgical History:  Past Surgical History:  Procedure Laterality Date    endoscopy with dilation     UPPER GASTROINTESTINAL ENDOSCOPY     Allergies:  No Known Allergies Family History:  Family History  Problem Relation Age of Onset   Hypertension Mother    Alzheimer's disease Mother    Breast  cancer Sister 42   Breast cancer Sister    Breast cancer Maternal Grandmother    Colon cancer Neg Hx    Esophageal cancer Neg Hx    Inflammatory bowel disease Neg Hx    Liver disease Neg Hx    Pancreatic cancer Neg Hx    Stomach cancer Neg Hx    Rectal cancer Neg Hx        Objective:    BP 120/76   Pulse (!) 58   Wt 181 lb 12.8 oz (82.5 kg)   BMI 30.96 kg/m   Wt Readings from Last 3 Encounters:  10/24/23 181 lb 12.8 oz (82.5 kg)  10/21/22 184 lb 9.6 oz (83.7 kg)  08/13/22 187 lb (84.8 kg)    Physical Exam Vitals and nursing note reviewed.  Constitutional:      General: She is not in acute distress.    Appearance: Normal appearance.  HENT:     Head: Normocephalic and atraumatic.     Right Ear: Hearing, tympanic membrane, ear canal and external ear normal.     Left Ear: Hearing, tympanic membrane, ear canal and external ear normal.     Nose: Nose normal.     Right Sinus: No maxillary sinus tenderness or frontal sinus tenderness.     Left Sinus: No maxillary sinus tenderness or frontal sinus tenderness.     Mouth/Throat:     Lips: Pink.     Mouth: Mucous membranes are moist.     Pharynx: Oropharynx is clear.  Eyes:     General: Lids are normal. Vision grossly intact.     Extraocular Movements: Extraocular movements intact.     Conjunctiva/sclera: Conjunctivae normal.     Pupils: Pupils are equal, round, and reactive to light.     Funduscopic exam:    Right eye: Red reflex present.        Left eye: Red reflex present.    Visual Fields: Right eye visual fields normal and left eye visual fields normal.  Neck:     Thyroid: No thyromegaly.     Vascular: No carotid bruit.  Cardiovascular:     Rate and Rhythm: Normal rate and regular rhythm.     Chest Wall: PMI is not displaced.     Pulses: Normal pulses.          Dorsalis pedis pulses are 2+ on the right side and 2+ on the left side.       Posterior tibial pulses are 2+ on the right side and 2+ on the left side.      Heart sounds: Normal heart sounds. No murmur heard. Pulmonary:  Effort: Pulmonary effort is normal. No respiratory distress.     Breath sounds: Normal breath sounds.  Abdominal:     General: Abdomen is flat. Bowel sounds are normal. There is no distension.     Palpations: Abdomen is soft. There is no hepatomegaly, splenomegaly or mass.     Tenderness: There is no abdominal tenderness. There is no right CVA tenderness, left CVA tenderness, guarding or rebound.  Musculoskeletal:        General: Normal range of motion.     Cervical back: Full passive range of motion without pain, normal range of motion and neck supple. No tenderness.     Right lower leg: No edema.     Left lower leg: No edema.  Feet:     Left foot:     Toenail Condition: Left toenails are normal.  Lymphadenopathy:     Cervical: No cervical adenopathy.     Upper Body:     Right upper body: No supraclavicular adenopathy.     Left upper body: No supraclavicular adenopathy.  Skin:    General: Skin is warm and dry.     Capillary Refill: Capillary refill takes less than 2 seconds.     Nails: There is no clubbing.  Neurological:     General: No focal deficit present.     Mental Status: She is alert and oriented to person, place, and time.     GCS: GCS eye subscore is 4. GCS verbal subscore is 5. GCS motor subscore is 6.     Sensory: Sensation is intact.     Motor: Motor function is intact.     Coordination: Coordination is intact.     Gait: Gait is intact.     Deep Tendon Reflexes: Reflexes are normal and symmetric.  Psychiatric:        Attention and Perception: Attention normal.        Mood and Affect: Mood normal.        Speech: Speech normal.        Behavior: Behavior normal. Behavior is cooperative.        Thought Content: Thought content normal.        Cognition and Memory: Cognition and memory normal.        Judgment: Judgment normal.      Results for orders placed or performed in visit on 10/21/22  CBC  with Differential/Platelet   Collection Time: 10/21/22  8:35 AM  Result Value Ref Range   WBC 4.9 3.4 - 10.8 x10E3/uL   RBC 4.80 3.77 - 5.28 x10E6/uL   Hemoglobin 14.1 11.1 - 15.9 g/dL   Hematocrit 91.4 78.2 - 46.6 %   MCV 91 79 - 97 fL   MCH 29.4 26.6 - 33.0 pg   MCHC 32.4 31.5 - 35.7 g/dL   RDW 95.6 21.3 - 08.6 %   Platelets 260 150 - 450 x10E3/uL   Neutrophils 58 Not Estab. %   Lymphs 30 Not Estab. %   Monocytes 7 Not Estab. %   Eos 4 Not Estab. %   Basos 1 Not Estab. %   Neutrophils Absolute 2.8 1.4 - 7.0 x10E3/uL   Lymphocytes Absolute 1.5 0.7 - 3.1 x10E3/uL   Monocytes Absolute 0.3 0.1 - 0.9 x10E3/uL   EOS (ABSOLUTE) 0.2 0.0 - 0.4 x10E3/uL   Basophils Absolute 0.0 0.0 - 0.2 x10E3/uL   Immature Granulocytes 0 Not Estab. %   Immature Grans (Abs) 0.0 0.0 - 0.1 x10E3/uL  Comprehensive metabolic panel   Collection  Time: 10/21/22  8:35 AM  Result Value Ref Range   Glucose 84 70 - 99 mg/dL   BUN 15 6 - 24 mg/dL   Creatinine, Ser 1.61 0.57 - 1.00 mg/dL   eGFR 69 >09 UE/AVW/0.98   BUN/Creatinine Ratio 15 9 - 23   Sodium 141 134 - 144 mmol/L   Potassium 4.6 3.5 - 5.2 mmol/L   Chloride 102 96 - 106 mmol/L   CO2 25 20 - 29 mmol/L   Calcium 9.5 8.7 - 10.2 mg/dL   Total Protein 6.8 6.0 - 8.5 g/dL   Albumin 4.4 3.8 - 4.9 g/dL   Globulin, Total 2.4 1.5 - 4.5 g/dL   Albumin/Globulin Ratio 1.8 1.2 - 2.2   Bilirubin Total 0.4 0.0 - 1.2 mg/dL   Alkaline Phosphatase 72 44 - 121 IU/L   AST 21 0 - 40 IU/L   ALT 6 0 - 32 IU/L  Lipid panel   Collection Time: 10/21/22  8:35 AM  Result Value Ref Range   Cholesterol, Total 207 (H) 100 - 199 mg/dL   Triglycerides 85 0 - 149 mg/dL   HDL 62 >11 mg/dL   VLDL Cholesterol Cal 15 5 - 40 mg/dL   LDL Chol Calc (NIH) 914 (H) 0 - 99 mg/dL   Chol/HDL Ratio 3.3 0.0 - 4.4 ratio  TSH   Collection Time: 10/21/22  8:35 AM  Result Value Ref Range   TSH 3.370 0.450 - 4.500 uIU/mL  VITAMIN D 25 Hydroxy (Vit-D Deficiency, Fractures)   Collection  Time: 10/21/22  8:35 AM  Result Value Ref Range   Vit D, 25-Hydroxy 34.8 30.0 - 100.0 ng/mL       Assessment & Plan:   Problem List Items Addressed This Visit     Encounter for annual physical exam - Primary   CPE completed today. Review of HM activities and recommendations discussed and provided on AVS. Anticipatory guidance, diet, and exercise recommendations provided. Medications, allergies, and hx reviewed and updated as necessary. Orders placed as listed below.  Plan: - Labs ordered. Will make changes as necessary based on results.  - I will review these results and send recommendations via MyChart or a telephone call.  - F/U with CPE in 1 year or sooner for acute/chronic health needs as directed.        Relevant Orders   CBC with Differential/Platelet (Completed)   CMP14+EGFR (Completed)   Lipid panel (Completed)   Vitamin D, 25-hydroxy (Completed)   Chronic knee pain   Chronic knee pain, likely related to tennis activity. No significant changes or acute exacerbations reported. Advised to continue current activity level and monitor for any worsening symptoms. - Continue current activity level - Monitor for any worsening symptom      Hip pain   Intermittent hip pain, likely due to bursitis, exacerbated by sleeping on an uncomfortable mattress. Pain improves with a firmer mattress. Remains active with tennis and daily exercise. Bursitis can be managed with ice, heat, gentle stretching, and avoiding pressure on the affected area. Ibuprofen can be used for pain relief if necessary. No need to stop exercising unless pain significantly worsens. - Recommend ice and heat application - Advise gentle stretching exercises - Suggest avoiding pressure on the affected area - Consider ibuprofen for pain relief if necessary      Elevated LDL cholesterol level   Labs pending today. No alarm symptoms present.  -Keep fat intake to a minimum  -Continue regular exercise.  Relevant  Orders   Lipid panel (Completed)   Other Visit Diagnoses       Need for influenza vaccination       Relevant Orders   Flu vaccine trivalent PF, 6mos and older(Flulaval,Afluria,Fluarix,Fluzone) (Completed)     Vitamin D deficiency       Relevant Orders   Vitamin D, 25-hydroxy (Completed)         Follow up plan: Return in about 1 year (around 10/23/2024) for CPE- labs 1-2 weeks before.  NEXT PREVENTATIVE PHYSICAL DUE IN 1 YEAR.  PATIENT COUNSELING PROVIDED FOR ALL ADULT PATIENTS: A well balanced diet low in saturated fats, cholesterol, and moderation in carbohydrates.  This can be as simple as monitoring portion sizes and cutting back on sugary beverages such as soda and juice to start with.    Daily water consumption of at least 64 ounces.  Physical activity at least 180 minutes per week.  If just starting out, start 10 minutes a day and work your way up.   This can be as simple as taking the stairs instead of the elevator and walking 2-3 laps around the office  purposefully every day.   STD protection, partner selection, and regular testing if high risk.  Limited consumption of alcoholic beverages if alcohol is consumed. For men, I recommend no more than 14 alcoholic beverages per week, spread out throughout the week (max 2 per day). Avoid "binge" drinking or consuming large quantities of alcohol in one setting.  Please let me know if you feel you may need help with reduction or quitting alcohol consumption.   Avoidance of nicotine, if used. Please let me know if you feel you may need help with reduction or quitting nicotine use.   Daily mental health attention. This can be in the form of 5 minute daily meditation, prayer, journaling, yoga, reflection, etc.  Purposeful attention to your emotions and mental state can significantly improve your overall wellbeing  and  Health.  Please know that I am here to help you with all of your health care goals and am happy to work with  you to find a solution that works best for you.  The greatest advice I have received with any changes in life are to take it one step at a time, that even means if all you can focus on is the next 60 seconds, then do that and celebrate your victories.  With any changes in life, you will have set backs, and that is OK. The important thing to remember is, if you have a set back, it is not a failure, it is an opportunity to try again! Screening Testing Mammogram Every 1 -2 years based on history and risk factors Starting at age 61 Pap Smear Ages 21-39 every 3 years Ages 27-65 every 5 years with HPV testing More frequent testing may be required based on results and history Colon Cancer Screening Every 1-10 years based on test performed, risk factors, and history Starting at age 63 Bone Density Screening Every 2-10 years based on history Starting at age 38 for women Recommendations for men differ based on medication usage, history, and risk factors AAA Screening One time ultrasound Men 38-18 years old who have every smoked Lung Cancer Screening Low Dose Lung CT every 12 months Age 68-80 years with a 30 pack-year smoking history who still smoke or who have quit within the last 15 years   Screening Labs Routine  Labs: Complete Blood Count (CBC), Complete Metabolic Panel (CMP),  Cholesterol (Lipid Panel) Every 6-12 months based on history and medications May be recommended more frequently based on current conditions or previous results Hemoglobin A1c Lab Every 3-12 months based on history and previous results Starting at age 87 or earlier with diagnosis of diabetes, high cholesterol, BMI >26, and/or risk factors Frequent monitoring for patients with diabetes to ensure blood sugar control Thyroid Panel (TSH) Every 6 months based on history, symptoms, and risk factors May be repeated more often if on medication HIV One time testing for all patients 17 and older May be repeated more  frequently for patients with increased risk factors or exposure Hepatitis C One time testing for all patients 64 and older May be repeated more frequently for patients with increased risk factors or exposure Gonorrhea, Chlamydia Every 12 months for all sexually active persons 13-24 years Additional monitoring may be recommended for those who are considered high risk or who have symptoms Every 12 months for any woman on birth control, regardless of sexual activity PSA Men 54-49 years old with risk factors Additional screening may be recommended from age 60-69 based on risk factors, symptoms, and history  Vaccine Recommendations Tetanus Booster All adults every 10 years Flu Vaccine All patients 6 months and older every year COVID Vaccine All patients 12 years and older Initial dosing with booster May recommend additional booster based on age and health history HPV Vaccine 2 doses all patients age 60-26 Dosing may be considered for patients over 26 Shingles Vaccine (Shingrix) 2 doses all adults 55 years and older Pneumonia (Pneumovax 23) All adults 65 years and older May recommend earlier dosing based on health history One year apart from Prevnar 13 Pneumonia (Prevnar 26) All adults 65 years and older Dosed 1 year after Pneumovax 23 Pneumonia (Prevnar 20) One time alternative to the two dosing of 13 and 23 For all adults with initial dose of 23, 20 is recommended 1 year later For all adults with initial dose of 13, 23 is still recommended as second option 1 year later

## 2023-10-27 NOTE — Assessment & Plan Note (Signed)

## 2023-10-27 NOTE — Assessment & Plan Note (Signed)
Labs pending today. No alarm symptoms present.  -Keep fat intake to a minimum  -Continue regular exercise.

## 2023-10-27 NOTE — Assessment & Plan Note (Signed)
Intermittent hip pain, likely due to bursitis, exacerbated by sleeping on an uncomfortable mattress. Pain improves with a firmer mattress. Remains active with tennis and daily exercise. Bursitis can be managed with ice, heat, gentle stretching, and avoiding pressure on the affected area. Ibuprofen can be used for pain relief if necessary. No need to stop exercising unless pain significantly worsens. - Recommend ice and heat application - Advise gentle stretching exercises - Suggest avoiding pressure on the affected area - Consider ibuprofen for pain relief if necessary

## 2024-07-26 ENCOUNTER — Other Ambulatory Visit: Payer: Self-pay | Admitting: Obstetrics and Gynecology

## 2024-07-26 DIAGNOSIS — Z1231 Encounter for screening mammogram for malignant neoplasm of breast: Secondary | ICD-10-CM

## 2024-07-27 ENCOUNTER — Ambulatory Visit
Admission: RE | Admit: 2024-07-27 | Discharge: 2024-07-27 | Disposition: A | Discharge status: Home / Self Care | Source: Ambulatory Visit | Attending: Obstetrics and Gynecology | Admitting: Obstetrics and Gynecology

## 2024-07-27 DIAGNOSIS — Z1231 Encounter for screening mammogram for malignant neoplasm of breast: Secondary | ICD-10-CM

## 2024-08-06 ENCOUNTER — Other Ambulatory Visit (INDEPENDENT_AMBULATORY_CARE_PROVIDER_SITE_OTHER): Payer: Self-pay

## 2024-08-06 ENCOUNTER — Ambulatory Visit: Admitting: Orthopedic Surgery

## 2024-08-06 ENCOUNTER — Encounter: Payer: Self-pay | Admitting: Orthopedic Surgery

## 2024-08-06 DIAGNOSIS — M25561 Pain in right knee: Secondary | ICD-10-CM

## 2024-08-06 DIAGNOSIS — G8929 Other chronic pain: Secondary | ICD-10-CM | POA: Diagnosis not present

## 2024-08-06 DIAGNOSIS — M545 Low back pain, unspecified: Secondary | ICD-10-CM

## 2024-08-06 NOTE — Progress Notes (Unsigned)
 Office Visit Note   Patient: Erica David           Date of Birth: [birth year]           MRN: [id number] Visit Date: 08/06/2024 Requested by: Oris Camie BRAVO, NP [address] [place],  [place] [address] PCP: Oris Camie BRAVO, NP  Subjective: Chief Complaint  Patient presents with   Lower Back - Pain   Right Knee - Pain    HPI: Erica David is a 53 y.o. female who presents to the office reporting right equal to left superior SI joint pain in the back as well as right knee pain.  She notices back pain while she was playing pickle ball.  She had to stop playing.  She was having some symptoms in the back before playing pickle ball and tennis but has not done so in about a month.   .  Has a constant dull ache in her back with no radiation.  Position and medications do not reallyi improve or worsen her symptoms.  She has used IcyHot with occasional relief.  She has been going to burn [facility] during the morning but not really going full out because of her back.  She also reports right knee pain.  She does wear a brace.  Feels unsteady when she is playing around the patella.  Does not take any medication for that.  She has slightly more right knee pain that she had a month ago.  She has avoided playing pickle ball and tennis for the past 4 weeks.              ROS: All systems reviewed are negative as they relate to the chief complaint within the history of present illness.  Patient denies fevers or chills.  Assessment & Plan: Visit Diagnoses:  1. Acute bilateral low back pain, unspecified whether sciatica present   2. Chronic pain of right knee     Plan: Impression is low back pain which really looks to be more facet joint related as opposed to radicular nature.  Could also be coming from her SI joints but bilateral SI joint involvement would be unusual.  Denies any other joint complaints.  Plan at this time is Mobic  15 mg a day for 7 to 14 days and as needed.  If that does  not help then MRI scanning would be next to the back with possible ESI's to follow and we could also consider right knee injection.  Right knee is stable with no effusion and not much arthritis.  Does have some PERI retinacular tenderness.  Injection would be the next step for the knee if her symptoms worsen in the knee.  Follow-Up Instructions: No follow-ups on file.   Orders:  Orders Placed This Encounter  Procedures   XR KNEE 3 VIEW RIGHT   XR Lumbar Spine 2-3 Views   No orders of the defined types were placed in this encounter.     Procedures: No procedures performed   Clinical Data: No additional findings.  Objective: Vital Signs: There were no vitals taken for this visit.  Physical Exam:  Constitutional: Patient appears well-developed HEENT:  Head: Normocephalic Eyes:EOM are normal Neck: Normal range of motion Cardiovascular: Normal rate Pulmonary/chest: Effort normal Neurologic: Patient is alert Skin: Skin is warm Psychiatric: Patient has normal mood and affect  Ortho Exam: Patient has bilateral 5 out of 5 ankle dorsiflexion plantarflexion quad hamstring and hip flexion strength.  Bilateral palpable pedal pulses and  no paresthesias L1-S1 in either leg.  No nerve root tension signs.  Minimal pain with forward and lateral bending.  No groin pain with internal and external rotation of either leg.  No trochanteric tenderness.  No masses lymphadenopathy or skin changes around the back.   Right knee exam demonstrates full range of motion with no effusion.  Moderate patellofemoral crepitus on the right mild on the left.  Collateral cruciate ligaments are stable.  No groin pain with internal/external rotation of either leg.  Pedal pulses palpable.  Range of motion is full from 0-1 30.  No joint line tenderness is present.  Specialty Comments:  No specialty comments available.  Imaging: No results found.   PMFS History: Patient Active Problem List   Diagnosis Date Noted    Chronic knee pain 10/27/2023   Hip pain 10/27/2023   Elevated LDL cholesterol level 10/27/2023   Schatzki's ring 10/05/2018   Gastroesophageal reflux disease 10/05/2018   Encounter for annual physical exam 10/05/2018   History of esophagogastroduodenoscopy (EGD) 07/22/2018   Dilation of esophagus 07/22/2018   Esophageal dysphagia 07/22/2018   Past Medical History:  Diagnosis Date   Epicondylitis elbow, medial    Esophageal stricture    with dilation    Family History  Problem Relation Age of Onset   Hypertension Mother    Alzheimer's disease Mother    Breast cancer Sister 59   Breast cancer Sister    Breast cancer Maternal Grandmother    Colon cancer Neg Hx    Esophageal cancer Neg Hx    Inflammatory bowel disease Neg Hx    Liver disease Neg Hx    Pancreatic cancer Neg Hx    Stomach cancer Neg Hx    Rectal cancer Neg Hx     Past Surgical History:  Procedure Laterality Date    endoscopy with dilation     UPPER GASTROINTESTINAL ENDOSCOPY     Social History   Occupational History   Occupation: Airline pilot  Tobacco Use   Smoking status: Never   Smokeless tobacco: Never  Vaping Use   Vaping status: Never Used  Substance and Sexual Activity   Alcohol use: Yes    Comment: socially, 1 per day   Drug use: No   Sexual activity: Yes    Birth control/protection: I.U.D.

## 2024-08-07 ENCOUNTER — Other Ambulatory Visit: Payer: Self-pay | Admitting: Orthopedic Surgery

## 2024-08-07 ENCOUNTER — Encounter: Payer: Self-pay | Admitting: Orthopedic Surgery

## 2024-08-07 MED ORDER — MELOXICAM 15 MG PO TABS
15.0000 mg | ORAL_TABLET | Freq: Every day | ORAL | 0 refills | Status: DC
Start: 2024-08-07 — End: 2024-08-21

## 2024-08-07 NOTE — Telephone Encounter (Signed)
 Meds sent

## 2024-08-08 MED ORDER — MELOXICAM 15 MG PO TABS: 15.0000 mg | ORAL_TABLET | Freq: Every day | ORAL | 0 refills | Status: AC

## 2024-09-17 ENCOUNTER — Encounter: Payer: Self-pay | Admitting: Radiology

## 2024-10-17 ENCOUNTER — Other Ambulatory Visit: Payer: 59

## 2024-10-18 ENCOUNTER — Other Ambulatory Visit: Payer: 59

## 2024-10-23 ENCOUNTER — Other Ambulatory Visit: Payer: 59

## 2024-10-23 ENCOUNTER — Ambulatory Visit: Admitting: Family Medicine

## 2024-10-23 ENCOUNTER — Encounter: Payer: Self-pay | Admitting: Family Medicine

## 2024-10-23 VITALS — BP 122/84 | HR 61 | Wt 184.4 lb

## 2024-10-23 DIAGNOSIS — H1013 Acute atopic conjunctivitis, bilateral: Secondary | ICD-10-CM | POA: Diagnosis not present

## 2024-10-23 DIAGNOSIS — E559 Vitamin D deficiency, unspecified: Secondary | ICD-10-CM | POA: Diagnosis not present

## 2024-10-23 DIAGNOSIS — Z0000 Encounter for general adult medical examination without abnormal findings: Secondary | ICD-10-CM

## 2024-10-23 LAB — LIPID PANEL

## 2024-10-23 MED ORDER — OLOPATADINE HCL 0.1 % OP SOLN: 1.0000 [drp] | Freq: Two times a day (BID) | OPHTHALMIC | 12 refills | Status: AC

## 2024-10-23 MED ORDER — ERYTHROMYCIN 5 MG/GM OP OINT: 1.0000 | TOPICAL_OINTMENT | Freq: Every day | OPHTHALMIC | 0 refills | Status: AC

## 2024-10-23 NOTE — Progress Notes (Signed)
   Name: Erica David   Date of Visit: 10/23/24   Date of last visit with me: Visit date not found   CHIEF COMPLAINT:  Chief Complaint  Patient presents with   Acute Visit    Woke up this morning with crusty, leaky eye, red, and swollen.        HPI:  Discussed the use of AI scribe software for clinical note transcription with the patient, who gave verbal consent to proceed.  History of Present Illness Erica David is a 53 year old female who presents with acute onset of eye irritation and discharge.  She experienced sudden onset of eye irritation and discharge this morning, describing her eye as 'leaking' and 'crusty' upon waking.  She has a history of mild pollen allergies, for which she typically takes Claritin with good effect. However, she notes that this episode is unlike her usual allergic reactions.  Her son, who was home from college for Thanksgiving, experienced similar symptoms. He left over a week ago, and she believes it is unlikely that she contracted anything from him.  She denies any recent trauma to the eye and has been working from home, minimizing exposure to potential irritants or infectious agents.     OBJECTIVE:       10/24/2023    2:04 PM  Depression screen PHQ 2/9  Decreased Interest 0  Down, Depressed, Hopeless 0  PHQ - 2 Score 0     BP Readings from Last 3 Encounters:  10/23/24 122/84  10/24/23 120/76  10/21/22 118/80    BP 122/84   Pulse 61   Wt 184 lb 6.4 oz (83.6 kg)   SpO2 98%   BMI 31.41 kg/m    Physical Exam HEENT: Right eye with significant tearing and mild inflammation.  Physical Exam Constitutional:      Appearance: Normal appearance. She is normal weight.  Eyes:     General:        Right eye: Discharge present.        Left eye: Discharge present.    Comments: B/L conjuctivitis   Neurological:     Mental Status: She is alert.     ASSESSMENT/PLAN:   Assessment & Plan Vitamin D   deficiency  Encounter for annual physical exam  Allergic conjunctivitis of both eyes    Assessment and Plan Assessment & Plan Acute on chronic allergic conjunctivitis Likely allergic conjunctivitis due to pollen exposure. Differential includes bacterial infection and foreign body, less likely without trauma and persistent symptoms.  - Conitnue claritin daily  - Prescribed topical antihistamine eye drops. - Advised warm compresses. - Prescribed erythromycin  antibiotic eye drops if no improvement in 2-3 days. - Instructed on application of antibiotic drops under eyelid if needed.     Erica David A. Vita MD [facility]

## 2024-10-24 ENCOUNTER — Ambulatory Visit: Payer: Self-pay | Admitting: Nurse Practitioner

## 2024-10-24 LAB — LIPID PANEL
Cholesterol, Total: 214 mg/dL — AB (ref 100–199)
HDL: 66 mg/dL (ref >39)
LDL CALC COMMENT:: 3.2 ratio (ref 0.0–4.4)
LDL Chol Calc (NIH): 127 mg/dL — AB (ref 0–99)
Triglycerides: 120 mg/dL (ref 0–149)
VLDL Cholesterol Cal: 21 mg/dL (ref 5–40)

## 2024-10-24 LAB — CBC WITH DIFFERENTIAL/PLATELET
Basophils Absolute: 0 x10E3/uL (ref 0.0–0.2)
Basos: 1 %
EOS (ABSOLUTE): 0.2 x10E3/uL (ref 0.0–0.4)
Eos: 3 %
Hematocrit: 43.4 % (ref 34.0–46.6)
Hemoglobin: 14.1 g/dL (ref 11.1–15.9)
Immature Grans (Abs): 0 x10E3/uL (ref 0.0–0.1)
Immature Granulocytes: 0 %
Lymphocytes Absolute: 1.8 x10E3/uL (ref 0.7–3.1)
Lymphs: 33 %
MCH: 30.4 pg (ref 26.6–33.0)
MCHC: 32.5 g/dL (ref 31.5–35.7)
MCV: 94 fL (ref 79–97)
Monocytes Absolute: 0.5 x10E3/uL (ref 0.1–0.9)
Monocytes: 8 %
Neutrophils Absolute: 3 x10E3/uL (ref 1.4–7.0)
Neutrophils: 55 %
Platelets: 242 x10E3/uL (ref 150–450)
RBC: 4.64 x10E6/uL (ref 3.77–5.28)
RDW: 12.8 % (ref 11.7–15.4)
WBC: 5.5 x10E3/uL (ref 3.4–10.8)

## 2024-10-24 LAB — CMP14+EGFR
ALT: 8 IU/L (ref 0–32)
AST: 24 IU/L (ref 0–40)
Albumin: 4.7 g/dL (ref 3.8–4.9)
Alkaline Phosphatase: 94 IU/L (ref 49–135)
BUN/Creatinine Ratio: 15 (ref 9–23)
BUN: 12 mg/dL (ref 6–24)
Bilirubin Total: 0.3 mg/dL (ref 0.0–1.2)
CO2: 24 mmol/L (ref 20–29)
Calcium: 9.3 mg/dL (ref 8.7–10.2)
Chloride: 102 mmol/L (ref 96–106)
Creatinine, Ser: 0.79 mg/dL (ref 0.57–1.00)
Globulin, Total: 2.4 g/dL (ref 1.5–4.5)
Glucose: 92 mg/dL (ref 70–99)
Potassium: 4.7 mmol/L (ref 3.5–5.2)
Sodium: 142 mmol/L (ref 134–144)
Total Protein: 7.1 g/dL (ref 6.0–8.5)
eGFR: 88 mL/min/1.73 (ref >59)

## 2024-10-24 LAB — TSH: TSH: 3.81 u[IU]/mL (ref 0.450–4.500)

## 2024-10-24 LAB — VITAMIN D 25 HYDROXY (VIT D DEFICIENCY, FRACTURES): Vit D, 25-Hydroxy: 35.1 ng/mL (ref 30.0–100.0)

## 2024-10-24 LAB — HEMOGLOBIN A1C
Est. average glucose Bld gHb Est-mCnc: 117 mg/dL
Hgb A1c MFr Bld: 5.7 % — ABNORMAL HIGH (ref 4.8–5.6)

## 2024-10-25 ENCOUNTER — Encounter: Payer: 59 | Admitting: Nurse Practitioner

## 2024-10-26 NOTE — Progress Notes (Unsigned)
 Flu: today  Covid: no Pneumonia: no Shingles: no PAP: Dr. Alona obgyn    [name], DNP, AGNP-c [facility] [address] [place], [place] [address] Main Office [phone] VISIT TYPE: CPE on 10/29/2024 Today's Vitals   10/29/24 1356  BP: 120/82  Pulse: 64  Weight: 184 lb 12.8 oz (83.8 kg)  Height: 5' 4 (1.626 m)   Body mass index is 31.72 kg/m.  Wt Readings from Last 3 Encounters:  10/29/24 184 lb 12.8 oz (83.8 kg)  10/23/24 184 lb 6.4 oz (83.6 kg)  10/24/23 181 lb 12.8 oz (82.5 kg)     Subjective:  Annual Exam (CPE, had labs last week, )   History of Present Illness Erica David is a 53 year old female who presents for her annual physical exam.  She has noted a slight elevation in blood sugar levels, although she has no history of elevated blood sugar. She is concerned about the potential for developing diabetes. Her diet includes a lot of protein, with occasional snacks in the afternoon. She exercises regularly, going to the gym every day except "Sunday, and previously played tennis/pickleball two to three times a week until she experienced muscle pain in her SI joint. She denies any family history of DM.   She recently stopped playing tennis/pickleball due to muscle pain in her sacroiliac joint, which was evaluated by an orthopedic specialist. An X-ray was performed, and she was prescribed meloxicam, which alleviated the pain. She continues to engage in physical activities. She questions how to prevent this from recurring.   She discusses her dietary habits, noting that she avoids fast food and tries to maintain a balanced diet. She occasionally skips lunch or eats late, which she acknowledges might affect her blood sugar levels, but does not feel this is significant enough to contribute to chronic elevation. She is not interested in weight loss programs that involve processed foods or injections and prefers to manage with her  current methods.   She reports a recent episode of conjunctivitis, which started in one eye and spread to the other. She was prescribed eye drops and ointment, which have not fully resolved the symptoms after six days. She describes the eye as 'leaky' and 'sticky' but notes improvement in drainage starting today. She has a history of allergies, but does not feel that she has had exposure to anything she is aware of to have contributed to this.    Pertinent items are noted in HPI.     12" /15/2025    1:53 PM 10/24/2023    2:04 PM 10/21/2022    2:19 PM 08/12/2021    1:48 PM 07/07/2020    1:54 PM  Depression screen PHQ 2/9  Decreased Interest 0 0 0 0 0  Down, Depressed, Hopeless 0 0 0 0 0  PHQ - 2 Score 0 0 0 0 0  Altered sleeping 0      Tired, decreased energy 0      Change in appetite 0      Feeling bad or failure about yourself  0      Trouble concentrating 0      Moving slowly or fidgety/restless 0      Suicidal thoughts 0      PHQ-9 Score 0      Difficult doing work/chores Not difficult at all            No data to display  10/29/2024    1:53 PM 10/24/2023    2:04 PM 10/21/2022    2:19 PM 07/07/2020    1:54 PM 04/16/2019    8:51 AM  Fall Risk   Falls in the past year? 0 0 0 1 0   Number falls in past yr: 0 0 0 1 0   Injury with Fall? 0 0  0  1  0   Risk for fall due to :  No Fall Risks No Fall Risks    Follow up Falls evaluation completed Falls evaluation completed Falls evaluation completed        Data saved with a previous flowsheet row definition   Past medical history, surgical history, medications, allergies, family history and social history reviewed with patient today and changes made to appropriate areas of the chart.      Objective:    Physical Exam Vitals and nursing note reviewed.  Constitutional:      General: She is not in acute distress.    Appearance: Normal appearance.  HENT:     Head: Normocephalic and atraumatic.     Right Ear:  Hearing, tympanic membrane, ear canal and external ear normal.     Left Ear: Hearing, tympanic membrane, ear canal and external ear normal.     Nose: Nose normal.     Right Sinus: No maxillary sinus tenderness or frontal sinus tenderness.     Left Sinus: No maxillary sinus tenderness or frontal sinus tenderness.     Mouth/Throat:     Lips: Pink.     Mouth: Mucous membranes are moist.     Pharynx: Oropharynx is clear.  Eyes:     General: Lids are normal. Vision grossly intact.        Right eye: No foreign body or discharge.        Left eye: No foreign body or discharge.     Extraocular Movements: Extraocular movements intact.     Right eye: Normal extraocular motion.     Left eye: Normal extraocular motion.     Conjunctiva/sclera:     Right eye: Right conjunctiva is injected. No exudate.    Left eye: Left conjunctiva is injected. No exudate.    Pupils: Pupils are equal, round, and reactive to light.     Funduscopic exam:    Right eye: Red reflex present.        Left eye: Red reflex present.    Visual Fields: Right eye visual fields normal and left eye visual fields normal.  Neck:     Thyroid: No thyromegaly.     Vascular: No carotid bruit.  Cardiovascular:     Rate and Rhythm: Normal rate and regular rhythm.     Chest Wall: PMI is not displaced.     Pulses: Normal pulses.          Dorsalis pedis pulses are 2+ on the right side and 2+ on the left side.       Posterior tibial pulses are 2+ on the right side and 2+ on the left side.     Heart sounds: Normal heart sounds. No murmur heard. Pulmonary:     Effort: Pulmonary effort is normal. No respiratory distress.     Breath sounds: Normal breath sounds.  Abdominal:     General: Abdomen is flat. Bowel sounds are normal. There is no distension.     Palpations: Abdomen is soft. There is no hepatomegaly, splenomegaly or mass.     Tenderness: There is no abdominal  tenderness. There is no right CVA tenderness, left CVA tenderness,  guarding or rebound.  Musculoskeletal:        General: Normal range of motion.     Cervical back: Full passive range of motion without pain, normal range of motion and neck supple. No tenderness.     Right lower leg: No edema.     Left lower leg: No edema.  Feet:     Left foot:     Toenail Condition: Left toenails are normal.  Lymphadenopathy:     Cervical: No cervical adenopathy.     Upper Body:     Right upper body: No supraclavicular adenopathy.     Left upper body: No supraclavicular adenopathy.  Skin:    General: Skin is warm and dry.     Capillary Refill: Capillary refill takes less than 2 seconds.     Nails: There is no clubbing.  Neurological:     General: No focal deficit present.     Mental Status: She is alert and oriented to person, place, and time.     GCS: GCS eye subscore is 4. GCS verbal subscore is 5. GCS motor subscore is 6.     Sensory: Sensation is intact.     Motor: Motor function is intact.     Coordination: Coordination is intact.     Gait: Gait is intact.     Deep Tendon Reflexes: Reflexes are normal and symmetric.  Psychiatric:        Attention and Perception: Attention normal.        Mood and Affect: Mood normal.        Speech: Speech normal.        Behavior: Behavior normal. Behavior is cooperative.        Thought Content: Thought content normal.        Cognition and Memory: Cognition and memory normal.        Judgment: Judgment normal.         Assessment & Plan:   Assessment & Plan Encounter for annual physical exam CPE completed today. Review of HM activities and recommendations discussed and provided on AVS. Anticipatory guidance, diet, and exercise recommendations provided. Medications, allergies, and hx reviewed and updated as necessary. Orders placed as listed below.  Plan: - Labs discussed. Review of diet and exercise recommendations for management of pre-diabetes and HLD.  - F/U with CPE in 1 year or sooner for acute/chronic health  needs as directed.      Need for influenza vaccination  Orders:   Flu vaccine trivalent PF, 6mos and older(Flulaval,Afluria,Fluarix,Fluzone)  Chronic left SI joint pain Bilateral sacroiliac joint pain exacerbated by physical activity, particularly tennis. Pain managed with meloxicam , providing relief. Pain is common in mid-forties and may recur. - Continue meloxicam  as needed for pain management. - Consider ibuprofen or Aleve for pain relief if meloxicam  is not effective. - Recommended vitamin D  (1000-2000 IU daily) and calcium (800 mg daily) for bone health. - Discussed potential use of glucosamine and chondroitin for joint health. Orders:   meloxicam  (MOBIC ) 15 MG tablet; Take 1 tablet as needed once a day for sacroiliac joint pain.  Elevated LDL cholesterol level Slightly elevated LDL with good HDL levels. Cholesterol levels consistent over the past five years. No significant dietary changes noted. Discussed dietary modifications to manage cholesterol levels. - Continue to monitor cholesterol levels regularly. - Encouraged dietary modifications to reduce saturated fat intake and increase fiber. - Continue regular exercise regimen.     Prediabetes  Discussion of A1v 5.7% with recent labs. Recommendations include low carbohydrate intake with high protein and fiber options for optimal management. Discussion of exercise recommendations, including cardiovascular exercise of at least 120 minutes per week. Patient appears to have a healthy diet and activity plan without family history of diabetes, which does make this a surprising finding. No recent steroid use is reported. We discussed that this may be a genetic predisposition or there may not be an explanation for the sudden change. At this time I feel that it is reasonable to recheck in 1 year with regular labs unless she begins to have symptoms of increased urination, hunger, or thirst.     NEXT PREVENTATIVE PHYSICAL DUE IN 1  YEAR.    PATIENT COUNSELING PROVIDED FOR ALL ADULT PATIENTS: A well balanced diet low in saturated fats, cholesterol, and moderation in carbohydrates.  This can be as simple as monitoring portion sizes and cutting back on sugary beverages such as soda and juice to start with.    Daily water consumption of at least 64 ounces.  Physical activity at least 180 minutes per week.  If just starting out, start 10 minutes a day and work your way up.   This can be as simple as taking the stairs instead of the elevator and walking 2-3 laps around the office  purposefully every day.   STD protection, partner selection, and regular testing if high risk.  Limited consumption of alcoholic beverages if alcohol is consumed. For men, I recommend no more than 14 alcoholic beverages per week, spread out throughout the week (max 2 per day). Avoid binge drinking or consuming large quantities of alcohol in one setting.  Please let me know if you feel you may need help with reduction or quitting alcohol consumption.   Avoidance of nicotine, if used. Please let me know if you feel you may need help with reduction or quitting nicotine use.   Daily mental health attention. This can be in the form of 5 minute daily meditation, prayer, journaling, yoga, reflection, etc.  Purposeful attention to your emotions and mental state can significantly improve your overall wellbeing  and  Health.  Please know that I am here to help you with all of your health care goals and am happy to work with you to find a solution that works best for you.  The greatest advice I have received with any changes in life are to take it one step at a time, that even means if all you can focus on is the next 60 seconds, then do that and celebrate your victories.  With any changes in life, you will have set backs, and that is OK. The important thing to remember is, if you have a set back, it is not a failure, it is an opportunity to try  again! Screening Testing Mammogram Every 1 -2 years based on history and risk factors Starting at age 24 Pap Smear Ages 21-39 every 3 years Ages 19-65 every 5 years with HPV testing More frequent testing may be required based on results and history Colon Cancer Screening Every 1-10 years based on test performed, risk factors, and history Starting at age 63 Bone Density Screening Every 2-10 years based on history Starting at age 22 for women Recommendations for men differ based on medication usage, history, and risk factors AAA Screening One time ultrasound Men 59-70 years old who have every smoked Lung Cancer Screening Low Dose Lung CT every 12 months Age 78-80 years with  a 30 pack-year smoking history who still smoke or who have quit within the last 15 years   Screening Labs Routine  Labs: Complete Blood Count (CBC), Complete Metabolic Panel (CMP), Cholesterol (Lipid Panel) Every 6-12 months based on history and medications May be recommended more frequently based on current conditions or previous results Hemoglobin A1c Lab Every 3-12 months based on history and previous results Starting at age 24 or earlier with diagnosis of diabetes, high cholesterol, BMI >26, and/or risk factors Frequent monitoring for patients with diabetes to ensure blood sugar control Thyroid Panel (TSH) Every 6 months based on history, symptoms, and risk factors May be repeated more often if on medication HIV One time testing for all patients 86 and older May be repeated more frequently for patients with increased risk factors or exposure Hepatitis C One time testing for all patients 25 and older May be repeated more frequently for patients with increased risk factors or exposure Gonorrhea, Chlamydia Every 12 months for all sexually active persons 13-24 years Additional monitoring may be recommended for those who are considered high risk or who have symptoms Every 12 months for any woman on birth  control, regardless of sexual activity PSA Men 52-74 years old with risk factors Additional screening may be recommended from age 48-69 based on risk factors, symptoms, and history  Vaccine Recommendations Tetanus Booster All adults every 10 years Flu Vaccine All patients 6 months and older every year COVID Vaccine All patients 12 years and older Initial dosing with booster May recommend additional booster based on age and health history HPV Vaccine 2 doses all patients age 59-26 Dosing may be considered for patients over 26 Shingles Vaccine (Shingrix) 2 doses all adults 55 years and older Pneumonia (Pneumovax 23) All adults 65 years and older May recommend earlier dosing based on health history One year apart from Prevnar 13 Pneumonia (Prevnar 36) All adults 65 years and older Dosed 1 year after Pneumovax 23 Pneumonia (Prevnar 20) One time alternative to the two dosing of 13 and 23 For all adults with initial dose of 23, 20 is recommended 1 year later For all adults with initial dose of 13, 23 is still recommended as second option 1 year later

## 2024-10-29 ENCOUNTER — Encounter: Payer: Self-pay | Admitting: Nurse Practitioner

## 2024-10-29 ENCOUNTER — Ambulatory Visit (INDEPENDENT_AMBULATORY_CARE_PROVIDER_SITE_OTHER): Payer: 59 | Admitting: Nurse Practitioner

## 2024-10-29 VITALS — BP 120/82 | HR 64 | Ht 64.0 in | Wt 184.8 lb

## 2024-10-29 DIAGNOSIS — Z6831 Body mass index (BMI) 31.0-31.9, adult: Secondary | ICD-10-CM | POA: Diagnosis not present

## 2024-10-29 DIAGNOSIS — M533 Sacrococcygeal disorders, not elsewhere classified: Secondary | ICD-10-CM | POA: Diagnosis not present

## 2024-10-29 DIAGNOSIS — E7800 Pure hypercholesterolemia, unspecified: Secondary | ICD-10-CM

## 2024-10-29 DIAGNOSIS — R7303 Prediabetes: Secondary | ICD-10-CM | POA: Insufficient documentation

## 2024-10-29 DIAGNOSIS — G8929 Other chronic pain: Secondary | ICD-10-CM | POA: Diagnosis not present

## 2024-10-29 DIAGNOSIS — Z23 Encounter for immunization: Secondary | ICD-10-CM | POA: Diagnosis not present

## 2024-10-29 DIAGNOSIS — Z0000 Encounter for general adult medical examination without abnormal findings: Secondary | ICD-10-CM | POA: Diagnosis not present

## 2024-10-29 DIAGNOSIS — E66811 Obesity, class 1: Secondary | ICD-10-CM | POA: Diagnosis not present

## 2024-10-29 DIAGNOSIS — D225 Melanocytic nevi of trunk: Secondary | ICD-10-CM | POA: Insufficient documentation

## 2024-10-29 MED ORDER — MELOXICAM 15 MG PO TABS: ORAL_TABLET | ORAL | 3 refills | Status: AC

## 2024-10-29 NOTE — Assessment & Plan Note (Signed)
 CPE completed today. Review of HM activities and recommendations discussed and provided on AVS. Anticipatory guidance, diet, and exercise recommendations provided. Medications, allergies, and hx reviewed and updated as necessary. Orders placed as listed below.  Plan: - Labs discussed. Review of diet and exercise recommendations for management of pre-diabetes and HLD.  - F/U with CPE in 1 year or sooner for acute/chronic health needs as directed.

## 2024-10-29 NOTE — Assessment & Plan Note (Signed)
 Discussion of A1v 5.7% with recent labs. Recommendations include low carbohydrate intake with high protein and fiber options for optimal management. Discussion of exercise recommendations, including cardiovascular exercise of at least 120 minutes per week. Patient appears to have a healthy diet and activity plan without family history of diabetes, which does make this a surprising finding. No recent steroid use is reported. We discussed that this may be a genetic predisposition or there may not be an explanation for the sudden change. At this time I feel that it is reasonable to recheck in 1 year with regular labs unless she begins to have symptoms of increased urination, hunger, or thirst.

## 2024-10-29 NOTE — Assessment & Plan Note (Signed)
 Bilateral sacroiliac joint pain exacerbated by physical activity, particularly tennis. Pain managed with meloxicam , providing relief. Pain is common in mid-forties and may recur. - Continue meloxicam  as needed for pain management. - Consider ibuprofen or Aleve for pain relief if meloxicam  is not effective. - Recommended vitamin D  (1000-2000 IU daily) and calcium (800 mg daily) for bone health. - Discussed potential use of glucosamine and chondroitin for joint health. Orders:   meloxicam  (MOBIC ) 15 MG tablet; Take 1 tablet as needed once a day for sacroiliac joint pain.

## 2024-10-29 NOTE — Patient Instructions (Addendum)
 To help with bone, muscle, and joint health I recommend: Vitamin D  1000-2000IU daily  Calcium 800mg  daily  Keep up your diet and exercise management. You are doing all the right things.   Prediabetes: What to Know Prediabetes is when your blood sugar, also called glucose, is at a higher level than normal but not high enough for you to be diagnosed with type 2 diabetes (type 2 diabetes mellitus). Having prediabetes puts you at risk for getting type 2 diabetes. By making some healthy changes, you may be able to prevent or delay getting type 2 diabetes. This is important because type 2 diabetes can lead to serious problems. Some of these include: Heart disease. Stroke. Blindness. Kidney disease. Depression. Poor blood flow in the feet and legs. In very bad cases, this could lead to having a leg removed by surgery (amputation). What are the causes? The exact cause of prediabetes isn't known. It may result from insulin resistance. Insulin resistance happens when cells in the body don't respond properly to insulin that the body makes. This can cause too much sugar to build up in the blood. High blood sugar, also called hyperglycemia, can develop. What increases the risk? Having a family member with type 2 diabetes. Being older than 55 years of age. Having had a temporary form of diabetes during a pregnancy. This is called gestational diabetes. Having had polycystic ovary syndrome (PCOS). Being overweight or obese. Being inactive and not getting much exercise. Having a history of heart disease. This may include problems with cholesterol levels, high levels of blood fats, or high blood pressure. What are the signs or symptoms? You may have no symptoms. If you do have symptoms, they may include: Increased hunger. Increased thirst. Needing to pee more often. Changes in how you see, like blurry vision. Feeling tired. How is this diagnosed? Prediabetes can be diagnosed with blood tests that  check your blood sugar. One or more of these tests may be done: A fasting blood glucose (FBG) test. You won't be allowed to eat (you will fast) for at least 8 hours before a blood sample is taken. An A1C blood test, also called a hemoglobin A1C test. This test shows information about blood sugar levels over the past 2?3 months. An oral glucose tolerance test (OGTT). This test measures your blood sugar at two points in time: After you haven't eaten for a while. This is your baseline level. Two hours after you drink a beverage that has sugar in it. You may be diagnosed with prediabetes if: Your FBG is 100?125 mg/dL (4.3-3.0 mmol/L). Your A1C level is 5.7?6.4% (39-46 mmol/mol). Your OGTT result is 140?199 mg/dL (2.1-88 mmol/L). These blood tests may need to be done again to be sure of the diagnosis. How is this treated? Treatment may include making changes to your diet and lifestyle. These changes can help lower your blood sugar and keep you from getting type 2 diabetes. In some cases, medicine may be given to help lower your risk. Follow these instructions at home: Eating and drinking  Eat and drink as told. Follow a healthy meal plan. This includes eating lean proteins, whole grains, legumes, fresh fruits and vegetables, low-fat dairy products, and healthy fats. Meet with an expert in healthy eating called a dietitian. This person can help create a healthy eating plan that's right for you. Lifestyle Do moderate-intensity exercise. Do this for at least 30 minutes a day on 5 or more days each week, or as told by your health care  provider. A mix of activities may be best. Good choices include brisk walking, swimming, biking, and weight lifting. Try to lose weight if your provider says it's OK. Losing 5-7% of your body weight can help reverse insulin resistance. Do not drink alcohol if: Your provider tells you not to drink. You're pregnant, may be pregnant, or plan to become pregnant. If you  drink alcohol: Limit how much you have to: 0-1 drink a day if you're female. 0-2 drinks a day if you're female. Know how much alcohol is in your drink. In the [place] one drink is one 12 oz bottle of beer (355 mL), one 5 oz glass of wine (148 mL), or one 1 oz glass of hard liquor (44 mL). General instructions Take medicines only as told. You may be given medicines that help lower the risk of type 2 diabetes. Do not smoke, vape, or use nicotine or tobacco. Where to find more information American Diabetes Association: diabetes.org/about-diabetes/prediabetes Academy of Nutrition and Dietetics: eatright.org American Heart Association: Go to thisjobs.cz. Click the search icon. Type prediabetes in the search box. Contact a health care provider if: You have any of these symptoms: Increased hunger. Peeing more often than usual. Increased thirst. Feeling tired. Changes in how you see, like blurry vision. Feeling like you may throw up. Throwing up. Get help right away if: You have shortness of breath. You feel confused. This information is not intended to replace advice given to you by your health care provider. Make sure you discuss any questions you have with your health care provider. Document Revised: 06/05/2023 Document Reviewed: 06/05/2023 Elsevier Patient Education  2024 [employer].

## 2024-10-29 NOTE — Assessment & Plan Note (Signed)
 Slightly elevated LDL with good HDL levels. Cholesterol levels consistent over the past five years. No significant dietary changes noted. Discussed dietary modifications to manage cholesterol levels. - Continue to monitor cholesterol levels regularly. - Encouraged dietary modifications to reduce saturated fat intake and increase fiber. - Continue regular exercise regimen.

## 2024-10-30 ENCOUNTER — Other Ambulatory Visit: Payer: Self-pay | Admitting: Nurse Practitioner

## 2024-10-30 DIAGNOSIS — Z6831 Body mass index (BMI) 31.0-31.9, adult: Secondary | ICD-10-CM

## 2024-10-30 DIAGNOSIS — R7303 Prediabetes: Secondary | ICD-10-CM

## 2024-10-30 DIAGNOSIS — E7800 Pure hypercholesterolemia, unspecified: Secondary | ICD-10-CM

## 2024-10-30 DIAGNOSIS — Z0000 Encounter for general adult medical examination without abnormal findings: Secondary | ICD-10-CM

## 2024-10-30 DIAGNOSIS — K219 Gastro-esophageal reflux disease without esophagitis: Secondary | ICD-10-CM

## 2024-10-30 DIAGNOSIS — E559 Vitamin D deficiency, unspecified: Secondary | ICD-10-CM

## 2024-11-14 DIAGNOSIS — Z124 Encounter for screening for malignant neoplasm of cervix: Secondary | ICD-10-CM

## 2024-12-05 NOTE — Telephone Encounter (Signed)
 I have sent a referral to Annabella Shutter, NP for gynecology. She is wonderful. Located at [address]  I am fine seeing her daughter.

## 2025-10-21 ENCOUNTER — Other Ambulatory Visit

## 2025-10-31 ENCOUNTER — Encounter: Admitting: Nurse Practitioner
